# Patient Record
Sex: Female | Born: 2000 | Race: Black or African American | Hispanic: No | Marital: Single | Smoking: Never smoker
Health system: Southern US, Community
[De-identification: ages and names within clinical notes are randomized; demographics above are authoritative.]

## PROBLEM LIST (undated history)

## (undated) DIAGNOSIS — O139 Gestational [pregnancy-induced] hypertension without significant proteinuria, unspecified trimester: Secondary | ICD-10-CM

## (undated) DIAGNOSIS — Z3490 Encounter for supervision of normal pregnancy, unspecified, unspecified trimester: Secondary | ICD-10-CM

## (undated) DIAGNOSIS — D649 Anemia, unspecified: Secondary | ICD-10-CM

## (undated) HISTORY — DX: Encounter for supervision of normal pregnancy, unspecified, unspecified trimester: Z34.90

---
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
## 2018-02-12 ENCOUNTER — Other Ambulatory Visit: Payer: Self-pay | Admitting: Nurse Practitioner

## 2018-02-12 DIAGNOSIS — Z369 Encounter for antenatal screening, unspecified: Secondary | ICD-10-CM

## 2018-03-12 ENCOUNTER — Ambulatory Visit (HOSPITAL_BASED_OUTPATIENT_CLINIC_OR_DEPARTMENT_OTHER)
Admission: RE | Admit: 2018-03-12 | Discharge: 2018-03-12 | Disposition: A | Discharge status: Home / Self Care | Payer: Medicaid Other | Source: Ambulatory Visit | Attending: Obstetrics & Gynecology | Admitting: Obstetrics & Gynecology

## 2018-03-12 ENCOUNTER — Ambulatory Visit
Admission: RE | Admit: 2018-03-12 | Discharge: 2018-03-12 | Disposition: A | Discharge status: Home / Self Care | Payer: Medicaid Other | Source: Ambulatory Visit | Attending: Nurse Practitioner | Admitting: Nurse Practitioner

## 2018-03-12 VITALS — BP 117/62 | HR 75 | Temp 98.8°F | Resp 17 | Ht 65.0 in | Wt 113.6 lb

## 2018-03-12 DIAGNOSIS — Z3689 Encounter for other specified antenatal screening: Secondary | ICD-10-CM | POA: Diagnosis present

## 2018-03-12 DIAGNOSIS — Z369 Encounter for antenatal screening, unspecified: Secondary | ICD-10-CM | POA: Diagnosis not present

## 2018-03-12 DIAGNOSIS — Z3A12 12 weeks gestation of pregnancy: Secondary | ICD-10-CM | POA: Insufficient documentation

## 2018-03-12 NOTE — Progress Notes (Signed)
Referring physician:  [facility] Department Length of Consultation: 20 minutes   Lauren Romero  was referred to [facility] [place] for genetic counseling to review prenatal screening and testing options.  This note summarizes the information we discussed. The patient was counseled by Zandra Abts, genetic counseling intern, supervised by Katrina Stack, MS, CGC.    We offered the following routine screening tests for this pregnancy:  First trimester screening, which includes nuchal translucency ultrasound screen and first trimester maternal serum marker screening.  The nuchal translucency has approximately an 80% detection rate for Down syndrome and can be positive for other chromosome abnormalities as well as congenital heart defects.  When combined with a maternal serum marker screening, the detection rate is up to 90% for Down syndrome and up to 97% for trisomy 18.     Maternal serum marker screening, a blood test that measures pregnancy proteins, can provide risk assessments for Down syndrome, trisomy 18, and open neural tube defects (spina bifida, anencephaly). Because it does not directly examine the fetus, it cannot positively diagnose or rule out these problems.  Targeted ultrasound uses high frequency sound waves to create an image of the developing fetus.  An ultrasound is often recommended as a routine means of evaluating the pregnancy.  It is also used to screen for fetal anatomy problems (for example, a heart defect) that might be suggestive of a chromosomal or other abnormality.   Should these screening tests indicate an increased concern, then the following additional testing options would be offered:  The chorionic villus sampling procedure is available for first trimester chromosome analysis.  This involves the withdrawal of a small amount of chorionic villi (tissue from the developing placenta).  Risk of pregnancy loss is estimated to be  approximately 1 in 200 to 1 in 100 (0.5 to 1%).  There is approximately a 1% (1 in 100) chance that the CVS chromosome results will be unclear.  Chorionic villi cannot be tested for neural tube defects.     Amniocentesis involves the removal of a small amount of amniotic fluid from the sac surrounding the fetus with the use of a thin needle inserted through the maternal abdomen and uterus.  Ultrasound guidance is used throughout the procedure.  Fetal cells from amniotic fluid are directly evaluated and > 99.5% of chromosome problems and > 98% of open neural tube defects can be detected. This procedure is generally performed after the 15th week of pregnancy.  The main risks to this procedure include complications leading to miscarriage in less than 1 in 200 cases (0.5%).  As another option for information if the pregnancy is suspected to be an an increased chance for certain chromosome conditions, we also reviewed the availability of cell free fetal DNA testing from maternal blood to determine whether or not the baby may have either Down syndrome, trisomy 71, or trisomy 54.  This test utilizes a maternal blood sample and DNA sequencing technology to isolate circulating cell free fetal DNA from maternal plasma.  The fetal DNA can then be analyzed for DNA sequences that are derived from the three most common chromosomes involved in aneuploidy, chromosomes 13, 18, and 21.  If the overall amount of DNA is greater than the expected level for any of these chromosomes, aneuploidy is suspected.  While we do not consider it a replacement for invasive testing and karyotype analysis, a negative result from this testing would be reassuring, though not a guarantee of a normal chromosome  complement for the baby.  An abnormal result is certainly suggestive of an abnormal chromosome complement, though we would still recommend CVS or amniocentesis to confirm any findings from this testing.  Cystic Fibrosis and Spinal Muscular  Atrophy (SMA) screening were also discussed with the patient. Both conditions are recessive, which means that both parents must be carriers in order to have a child with the disease.  Cystic fibrosis (CF) is one of the most common genetic conditions in persons of Caucasian ancestry.  This condition occurs in approximately 1 in 2,500 Caucasian persons and results in thickened secretions in the lungs, digestive, and reproductive systems.  For a baby to be at risk for having CF, both of the parents must be carriers for this condition.  Approximately 1 in 5725 Caucasian persons is a carrier for CF.  Current carrier testing looks for the most common mutations in the gene for CF and can detect approximately 90% of carriers in the Caucasian population.  This means that the carrier screening can greatly reduce, but cannot eliminate, the chance for an individual to have a child with CF.  If an individual is found to be a carrier for CF, then carrier testing would be available for the partner. As part of [place]'s newborn screening profile, all babies born in the state of [place] will have a two-tier screening process.  Specimens are first tested to determine the concentration of immunoreactive trypsinogen (IRT).  The top 5% of specimens with the highest IRT values then undergo DNA testing using a panel of over 40 common CF mutations. SMA is a neurodegenerative disorder that leads to atrophy of skeletal muscle and overall weakness.  This condition is also more prevalent in the Caucasian population, with 1 in 40-1 in 60 persons being a carrier and 1 in 6,000-1 in 10,000 children being affected.  There are multiple forms of the disease, with some causing death in infancy to other forms with survival into adulthood.  The genetics of SMA is complex, but carrier screening can detect up to 95% of carriers in the Caucasian population.  Similar to CF, a negative result can greatly reduce, but cannot eliminate, the chance  to have a child with SMA.  Hemoglobinopathy screening was also discussed, as the patient and her partner of a African American ancestry.  We have reached out to [facility] regarding results of this testing, which is mentioned in her chart but no result was visible to us.  We inquired about the family history and pregnancy history.  The family history was reported to be unremarkable for birth defects, intellectual delays, recurrent pregnancy loss or known chromosome abnormalities. The patient's father passed away in his sleep recently and the patient is planning to [facility] in the near future.  If this were to uncover a cardiac condition which may affect this pregnancy, please refer her back for additional counseling.    Lauren Romero stated that this is her first pregnancy.  She reported no complications or exposures in this pregnancy that would be expected to increase the risk for birth defects.  After consideration of the options, Lauren Romero elected to proceed with an ultrasound and first trimester screening.  She declined carrier screening for CF, SMA and hemoglobinopathies.  If hemoglobinopathy screening has not been performed, this could be ordered at [facility] (or her newborn screening results may be available).  An ultrasound was performed at the time of the visit.  The gestational age was consistent  with 12 weeks.  Fetal anatomy could not be assessed due to early gestational age.  Please refer to the ultrasound report for details of that study.  Lauren Romero was encouraged to call with questions or concerns.  We can be contacted at [phone].  Labs ordered: first trimester screening  Cherly Anderson, MS, CGC

## 2018-03-12 NOTE — Progress Notes (Signed)
I have reviewed the genetic counseling and agree.  Janeann ForehandS. Maryjayne Kleven, MD

## 2018-03-19 ENCOUNTER — Telehealth: Payer: Self-pay | Admitting: Obstetrics and Gynecology

## 2018-03-19 NOTE — Telephone Encounter (Signed)
  Ms. Laiche elected to undergo First Trimester screening on 03/19/2018.  To review, first trimester screening, includes nuchal translucency ultrasound screen and/or first trimester maternal serum marker screening.  The nuchal translucency has approximately an 80% detection rate for Down syndrome and can be positive for other chromosome abnormalities as well as heart defects.  When combined with a maternal serum marker screening, the detection rate is up to 90% for Down syndrome and up to 97% for trisomy 13 and 18.     The results of the First Trimester Nuchal Translucency and Biochemical Screening were within normal range.  The risk for Down syndrome is now estimated to be less than 1 in 10,000.  The risk for Trisomy 13/18 is also estimated to be less than 1 in 10,000.  Should more definitive information be desired, we would offer amniocentesis.  Because we do not yet know the effectiveness of combined first and second trimester screening, we do not recommend a maternal serum screen to assess the chance for chromosome conditions.  However, if screening for neural tube defects is desired, maternal serum screening for AFP only can be performed between 15 and [redacted] weeks gestation.     Cherly Anderson, MS, CGC

## 2018-04-23 ENCOUNTER — Other Ambulatory Visit: Payer: Medicaid Other

## 2018-06-20 NOTE — L&D Delivery Note (Signed)
Delivery Note  First Stage: Labor onset: 0800  Augmentation : none Analgesia /Anesthesia intrapartum: fentanyl IVPM x 1 dose, epidural SROM at 1750, meconium  Second Stage: Complete dilation at 2034 Onset of pushing at 2034 FHR second stage Late decels, FHR to 70bpm.   Admitted in early labor with bloody show, SROM with meconium at 1750, FHR prolonged decel at 8cm around 1850, then fetal bradycardia at C/C/+3; Pt counseled regarding need to assist delivery due to bradycardia. Foley cath removed approx prior to delivery, Position confirmed in LOA position, vacuum applied to flexion point, vaginal tissue was clear of vacuum, pressure increased to green with next UC, assisted to crowning via flat kiwi with 1 pull with maternal pushing efforts, vacuum removed.  Delivery of a viable female infant on 09/07/18 at 2046 by Heloise Ochoa CNM delivery of fetal head in LOA position with restitution to LOT. No nuchal cord;  Anterior then posterior shoulders delivered easily with gentle downward traction. Baby vigorous at perineum, and placed on mom's chest, and attended to by peds.  Cord double clamped after cessation of pulsation, cut by grandmother of baby.    Third Stage: Placenta delivered spontaneously intact with 3VC @ 2050 Placenta disposition: routine disposal Uterine tone Firm / bleeding scant  2nd deg laceration identified  Anesthesia for repair: epidural Repair 2-0 Vicryl CT-1, 3-0 Vicryl SH Est. Blood Loss (mL):  Complications: none  Mom to postpartum.  Baby to Couplet care / Skin to Skin.  Newborn: Birth Weight: pending  Apgar Scores: 8/9 Feeding planned: formula

## 2018-09-03 ENCOUNTER — Inpatient Hospital Stay: Payer: Medicaid Other

## 2018-09-03 ENCOUNTER — Encounter: Payer: Self-pay | Admitting: Oncology

## 2018-09-03 ENCOUNTER — Inpatient Hospital Stay (HOSPITAL_BASED_OUTPATIENT_CLINIC_OR_DEPARTMENT_OTHER): Payer: Medicaid Other | Admitting: Oncology

## 2018-09-03 ENCOUNTER — Inpatient Hospital Stay: Payer: Medicaid Other | Attending: Oncology | Admitting: Oncology

## 2018-09-03 ENCOUNTER — Other Ambulatory Visit: Payer: Self-pay

## 2018-09-03 ENCOUNTER — Encounter (INDEPENDENT_AMBULATORY_CARE_PROVIDER_SITE_OTHER): Payer: Self-pay

## 2018-09-03 ENCOUNTER — Telehealth: Payer: Self-pay | Admitting: Oncology

## 2018-09-03 VITALS — BP 121/69 | HR 75 | Temp 97.9°F | Resp 18 | Ht 66.54 in | Wt 135.0 lb

## 2018-09-03 DIAGNOSIS — Z3A38 38 weeks gestation of pregnancy: Secondary | ICD-10-CM | POA: Diagnosis not present

## 2018-09-03 DIAGNOSIS — O9902 Anemia complicating childbirth: Secondary | ICD-10-CM

## 2018-09-03 DIAGNOSIS — O99019 Anemia complicating pregnancy, unspecified trimester: Secondary | ICD-10-CM

## 2018-09-03 LAB — CBC WITH DIFFERENTIAL/PLATELET
Abs Immature Granulocytes: 0.1 10*3/uL — ABNORMAL HIGH (ref 0.00–0.07)
Basophils Absolute: 0 10*3/uL (ref 0.0–0.1)
Basophils Relative: 0 %
EOS ABS: 0 10*3/uL (ref 0.0–0.5)
Eosinophils Relative: 0 %
HCT: 31.8 % — ABNORMAL LOW (ref 36.0–46.0)
Hemoglobin: 10.3 g/dL — ABNORMAL LOW (ref 12.0–15.0)
Immature Granulocytes: 1 %
Lymphocytes Relative: 18 %
Lymphs Abs: 1.5 10*3/uL (ref 0.7–4.0)
MCH: 27.7 pg (ref 26.0–34.0)
MCHC: 32.4 g/dL (ref 30.0–36.0)
MCV: 85.5 fL (ref 80.0–100.0)
Monocytes Absolute: 0.5 10*3/uL (ref 0.1–1.0)
Monocytes Relative: 6 %
Neutro Abs: 6.5 10*3/uL (ref 1.7–7.7)
Neutrophils Relative %: 75 %
Platelets: 245 10*3/uL (ref 150–400)
RBC: 3.72 MIL/uL — ABNORMAL LOW (ref 3.87–5.11)
RDW: 14.6 % (ref 11.5–15.5)
WBC: 8.6 10*3/uL (ref 4.0–10.5)
nRBC: 0 % (ref 0.0–0.2)

## 2018-09-03 LAB — IRON AND TIBC
Iron: 37 ug/dL (ref 28–170)
Saturation Ratios: 8 % — ABNORMAL LOW (ref 10.4–31.8)
TIBC: 484 ug/dL — ABNORMAL HIGH (ref 250–450)
UIBC: 447 ug/dL

## 2018-09-03 LAB — RETICULOCYTES
Immature Retic Fract: 22.4 % — ABNORMAL HIGH (ref 2.3–15.9)
RBC.: 3.72 MIL/uL — ABNORMAL LOW (ref 3.87–5.11)
RETIC COUNT ABSOLUTE: 76.3 10*3/uL (ref 19.0–186.0)
Retic Ct Pct: 2.1 % (ref 0.4–3.1)

## 2018-09-03 LAB — VITAMIN B12: Vitamin B-12: 254 pg/mL (ref 180–914)

## 2018-09-03 LAB — TSH: TSH: 0.841 u[IU]/mL (ref 0.350–4.500)

## 2018-09-03 LAB — FOLATE: Folate: 12.1 ng/mL (ref >5.9)

## 2018-09-03 LAB — FERRITIN: Ferritin: 7 ng/mL — ABNORMAL LOW (ref 11–307)

## 2018-09-03 NOTE — Telephone Encounter (Signed)
Pt. Mother called about appointment for this afternoon. Mother said she wanted to be seen this morning instead. Gave her a 10:45 appt. It is not 11:30 and PT still has not shown up. Marking appt as a no show.

## 2018-09-03 NOTE — Progress Notes (Signed)
Here for new pt evaluation. . Pt stated she lives w mother, is pregnant ( first ) and is due March 31/20202   Stated she "sometimes takes " her iron and pre natal vitamins.

## 2018-09-04 ENCOUNTER — Encounter: Payer: Self-pay | Admitting: *Deleted

## 2018-09-04 LAB — HAPTOGLOBIN: Haptoglobin: 129 mg/dL (ref 33–278)

## 2018-09-04 NOTE — Progress Notes (Signed)
I called the patient and informed her of her anemia work-up.  She has moderate anemia with a hemoglobin of 10.3.  iron studies do reveal iron deficiency with a low ferritin level of 7, elevated TIBC of 484 and a low iron saturation of 8%.  She also had borderline low B12 levels at 253.  Patient is due to deliver in 2 weeks time and the benefit of giving IV iron late in her pregnancy is uncertain.  Also given her pregnancy she is immunosuppressed and potentially at a higher risk in the face of a covert pandemic if she has to come to our clinic to get four infusions over the next 2 weeks.  Weighing the risks and benefits have recommended that she should take oral iron 325 mg twice a day along with oral B12 thousand micrograms p.o. daily.  Patient states that she will keep an alarm and make sure that she remains compliant with her oral medications.  If she feels that she cannot keep up with the oral regimen over the next few days she will call us and we will set her up for IV iron at that time.  Otherwise she will keep up her appointment with me in 3 months as scheduled and we will reevaluate her iron studies at that time and give her IV iron if need be  Dr. Owens Shark, MD, MPH [facility] at [facility] Pager- [phone] 09/04/2018 10:09 AM

## 2018-09-06 ENCOUNTER — Encounter: Payer: Self-pay | Admitting: Oncology

## 2018-09-06 NOTE — Progress Notes (Signed)
Hematology/Oncology Consult note [facility] [phone] [phone]  Patient Care Team: [facility], [facility] as [facility]CP - General (General Practice)   Name of the patient: Lauren Romero  [id number]  03/05/2001    Reason for referral- anemia in pregnancy   Referring physician- [place] [facility]  Date of visit: 09/06/18   History of presenting illness-patient is a 18 year old female currently in her third trimester of pregnancy and due to deliver end of March 2020.  She is G1, P1 L0.  She has been referred to us for anemia.Most recent H&H from January 2020 revealed hemoglobin of 9.9/hematocrit of 30.6.  Patient has not been consistently taking her prenatal multivitamins or her iron tablets.  She reports feeling mildly fatigued but denies other complaints.  Her pregnancy is otherwise proceeding uneventfully so far.  ECOG PS- 0  Pain scale- 0   Review of systems- Review of Systems  Constitutional: Positive for malaise/fatigue. Negative for chills, fever and weight loss.  HENT: Negative for congestion, ear discharge and nosebleeds.   Eyes: Negative for blurred vision.  Respiratory: Negative for cough, hemoptysis, sputum production, shortness of breath and wheezing.   Cardiovascular: Negative for chest pain, palpitations, orthopnea and claudication.  Gastrointestinal: Negative for abdominal pain, blood in stool, constipation, diarrhea, heartburn, melena, nausea and vomiting.  Genitourinary: Negative for dysuria, flank pain, frequency, hematuria and urgency.  Musculoskeletal: Negative for back pain, joint pain and myalgias.  Skin: Negative for rash.  Neurological: Negative for dizziness, tingling, focal weakness, seizures, weakness and headaches.  Endo/Heme/Allergies: Does not bruise/bleed easily.  Psychiatric/Behavioral: Negative for depression and suicidal ideas. The patient does not have insomnia.     No  Known Allergies  Patient Active Problem List   Diagnosis Date Noted  . First trimester screening      Past Medical History:  Diagnosis Date  . Medical history non-contributory   . Pregnant due date march 31/2020     History reviewed. No pertinent surgical history.  Social History   Socioeconomic History  . Marital status: Single    Spouse name: Not on file  . Number of children: Not on file  . Years of education: Not on file  . Highest education level: Not on file  Occupational History  . Occupation: None  Social Needs  . Financial resource strain: Not on file  . Food insecurity:    Worry: Not on file    Inability: Not on file  . Transportation needs:    Medical: Not on file    Non-medical: Not on file  Tobacco Use  . Smoking status: Never Smoker  . Smokeless tobacco: Never Used  Substance and Sexual Activity  . Alcohol use: No  . Drug use: Not Currently  . Sexual activity: Yes  Lifestyle  . Physical activity:    Days per week: Not on file    Minutes per session: Not on file  . Stress: Not on file  Relationships  . Social connections:    Talks on phone: Not on file    Gets together: Not on file    Attends religious service: Not on file    Active member of club or organization: Not on file    Attends meetings of clubs or organizations: Not on file    Relationship status: Not on file  . Intimate partner violence:    Fear of current or ex partner: Not on file    Emotionally abused: Not on file  Physically abused: Not on file    Forced sexual activity: Not on file  Other Topics Concern  . Not on file  Social History Narrative  . Not on file     Family History  Problem Relation Age of Onset  . Diabetes Maternal Grandmother   . Hypertension Maternal Grandmother   . Diabetes Paternal Grandmother   . Hypertension Paternal Grandmother      Current Outpatient Medications:  .  Ferrous Fumarate (IRON) 18 MG TBCR, Take 1 tablet (18 mg total) by mouth  daily. (Patient not taking: Reported on 09/03/2018), Disp: 30 each, Rfl: 0 .  Prenatal Vit-Fe Fumarate-FA (PRENATAL MULTIVITAMIN) TABS tablet, Take 1 tablet by mouth daily at 12 noon., Disp: , Rfl:    Physical exam:  Vitals:   09/03/18 1226  BP: 121/69  Pulse: 75  Resp: 18  Temp: 97.9 F (36.6 C)  TempSrc: Tympanic  Weight: 135 lb (61.2 kg)  Height: 5' 6.54" (1.69 m)   Physical Exam HENT:     Head: Normocephalic and atraumatic.  Eyes:     Pupils: Pupils are equal, round, and reactive to light.  Neck:     Musculoskeletal: Normal range of motion.  Cardiovascular:     Rate and Rhythm: Normal rate and regular rhythm.     Heart sounds: Normal heart sounds.  Pulmonary:     Effort: Pulmonary effort is normal.     Breath sounds: Normal breath sounds.  Abdominal:     Comments: Gravid uterus  Skin:    General: Skin is warm and dry.  Neurological:     Mental Status: She is alert and oriented to person, place, and time.        CMP Latest Ref Rng & Units 06/22/2016  Glucose 65 - 99 mg/dL 326(Z)  BUN 6 - 20 mg/dL 9  Creatinine 1.24 - 5.80 mg/dL 9.98  Sodium 338 - 250 mmol/L 137  Potassium 3.5 - 5.1 mmol/L 3.1(L)  Chloride 101 - 111 mmol/L 106  CO2 22 - 32 mmol/L 23  Calcium 8.9 - 10.3 mg/dL 9.4  Total Protein 6.5 - 8.1 g/dL -  Total Bilirubin 0.3 - 1.2 mg/dL -  Alkaline Phos 47 - 539 U/L -  AST 15 - 41 U/L -  ALT 14 - 54 U/L -   CBC Latest Ref Rng & Units 09/03/2018  WBC 4.0 - 10.5 K/uL 8.6  Hemoglobin 12.0 - 15.0 g/dL 10.3(L)  Hematocrit 36.0 - 46.0 % 31.8(L)  Platelets 150 - 400 K/uL 245     Assessment and plan- Patient is a 18 y.o. female referred for anemia in third trimester of pregnancy   Today I will check CBC with differential, ferritin and iron studies, B12 and folate, reticulocyte haptoglobin and TSH.  Given that she is so close to delivery I will see her 3 months from now with repeat CBC ferritin and iron studies.  I did call the patient after the results  of her blood work came back which were consistent with iron deficiency anemia given that her ferritin levels were low at 7 And iron studies showed a low iron saturation of 8% and elevated TIBC of 484.  B12 levels were also low normal at 254.  CBC showed moderate anemia with a hemoglobin of 10.3.  I spoke to the patient over the phone and discussed that she is very close to her delivery only 2 weeks away.  Options would be to take oral iron more consistently as well as oral  B12 thousand micrograms p.o. daily versus coming for for IV benefit infusions over the next 2 weeks which may not add a significant benefit given that she is so close to delivery.  Also puts her at risk for more infusion center visits during her pregnancy in the midst of covid pandemic.  However if she does not take oral iron consistently she will need IV iron.  Patient understands benefits of oral versus IV iron and states that she would take oral iron 325 mg twice a day consistently.  She knows to call us if she wishes to come for IV iron  Thank you for this kind referral and the opportunity to participate in the care of this patient   Visit Diagnosis 1. Antepartum anemia     Dr. Owens Shark, MD, MPH [facility] at [facility] [phone] 09/06/2018  1:59 PM

## 2018-09-07 ENCOUNTER — Inpatient Hospital Stay: Payer: Medicaid Other | Admitting: Anesthesiology

## 2018-09-07 ENCOUNTER — Inpatient Hospital Stay
Admission: EM | Admit: 2018-09-07 | Discharge: 2018-09-09 | DRG: 806 | Disposition: A | Discharge status: Home / Self Care | Payer: Medicaid Other | Attending: Obstetrics and Gynecology | Admitting: Obstetrics and Gynecology

## 2018-09-07 ENCOUNTER — Other Ambulatory Visit: Payer: Self-pay

## 2018-09-07 ENCOUNTER — Encounter: Payer: Self-pay | Admitting: *Deleted

## 2018-09-07 DIAGNOSIS — O9081 Anemia of the puerperium: Secondary | ICD-10-CM | POA: Diagnosis not present

## 2018-09-07 DIAGNOSIS — D62 Acute posthemorrhagic anemia: Secondary | ICD-10-CM | POA: Diagnosis not present

## 2018-09-07 DIAGNOSIS — O26893 Other specified pregnancy related conditions, third trimester: Secondary | ICD-10-CM | POA: Diagnosis present

## 2018-09-07 DIAGNOSIS — Z3A38 38 weeks gestation of pregnancy: Secondary | ICD-10-CM | POA: Diagnosis not present

## 2018-09-07 HISTORY — DX: Anemia, unspecified: D64.9

## 2018-09-07 LAB — CBC
HCT: 34 % — ABNORMAL LOW (ref 36.0–46.0)
Hemoglobin: 10.8 g/dL — ABNORMAL LOW (ref 12.0–15.0)
MCH: 27 pg (ref 26.0–34.0)
MCHC: 31.8 g/dL (ref 30.0–36.0)
MCV: 85 fL (ref 80.0–100.0)
NRBC: 0 % (ref 0.0–0.2)
Platelets: 258 10*3/uL (ref 150–400)
RBC: 4 MIL/uL (ref 3.87–5.11)
RDW: 14.7 % (ref 11.5–15.5)
WBC: 14.7 10*3/uL — ABNORMAL HIGH (ref 4.0–10.5)

## 2018-09-07 LAB — TYPE AND SCREEN
ABO/RH(D): B POS
Antibody Screen: NEGATIVE

## 2018-09-07 MED ORDER — PHENYLEPHRINE 40 MCG/ML (10ML) SYRINGE FOR IV PUSH (FOR BLOOD PRESSURE SUPPORT)
80.0000 ug | PREFILLED_SYRINGE | INTRAVENOUS | Status: DC | PRN
  Filled 2018-09-07: qty 10

## 2018-09-07 MED ORDER — LIDOCAINE HCL (PF) 1 % IJ SOLN
INTRAMUSCULAR | Status: DC | PRN
  Administered 2018-09-07: 3 mL

## 2018-09-07 MED ORDER — EPHEDRINE 5 MG/ML INJ
10.0000 mg | INTRAVENOUS | Status: DC | PRN
  Filled 2018-09-07: qty 2

## 2018-09-07 MED ORDER — ACETAMINOPHEN 325 MG PO TABS: 650.0000 mg | ORAL_TABLET | ORAL | Status: DC | PRN

## 2018-09-07 MED ORDER — LACTATED RINGERS IV SOLN
500.0000 mL | INTRAVENOUS | Status: DC | PRN
  Administered 2018-09-07: 500 mL via INTRAVENOUS

## 2018-09-07 MED ORDER — LIDOCAINE HCL (PF) 1 % IJ SOLN: 30.0000 mL | INTRAMUSCULAR | Status: DC | PRN

## 2018-09-07 MED ORDER — MISOPROSTOL 200 MCG PO TABS
ORAL_TABLET | ORAL | Status: AC
  Filled 2018-09-07: qty 4

## 2018-09-07 MED ORDER — DIPHENHYDRAMINE HCL 50 MG/ML IJ SOLN: 12.5000 mg | INTRAMUSCULAR | Status: DC | PRN

## 2018-09-07 MED ORDER — TERBUTALINE SULFATE 1 MG/ML IJ SOLN
INTRAMUSCULAR | Status: AC
  Administered 2018-09-07: 25 mg
  Filled 2018-09-07: qty 1

## 2018-09-07 MED ORDER — FENTANYL 2.5 MCG/ML W/ROPIVACAINE 0.15% IN NS 100 ML EPIDURAL (ARMC)
EPIDURAL | Status: DC | PRN
  Administered 2018-09-07: 12 mL/h via EPIDURAL

## 2018-09-07 MED ORDER — AMMONIA AROMATIC IN INHA
RESPIRATORY_TRACT | Status: AC
  Filled 2018-09-07: qty 10

## 2018-09-07 MED ORDER — BUPIVACAINE HCL (PF) 0.25 % IJ SOLN
INTRAMUSCULAR | Status: DC | PRN
  Administered 2018-09-07 (×2): 4 mL via EPIDURAL

## 2018-09-07 MED ORDER — OXYTOCIN BOLUS FROM INFUSION
500.0000 mL | Freq: Once | INTRAVENOUS | Status: AC
  Administered 2018-09-07: 500 mL via INTRAVENOUS

## 2018-09-07 MED ORDER — OXYTOCIN 10 UNIT/ML IJ SOLN
INTRAMUSCULAR | Status: AC
  Filled 2018-09-07: qty 2

## 2018-09-07 MED ORDER — LIDOCAINE HCL (PF) 1 % IJ SOLN
INTRAMUSCULAR | Status: AC
  Filled 2018-09-07: qty 30

## 2018-09-07 MED ORDER — FENTANYL CITRATE (PF) 100 MCG/2ML IJ SOLN
INTRAMUSCULAR | Status: AC
  Filled 2018-09-07: qty 2

## 2018-09-07 MED ORDER — FENTANYL-BUPIVACAINE-NACL 0.5-0.125-0.9 MG/250ML-% EP SOLN: 12.0000 mL/h | EPIDURAL | Status: DC | PRN

## 2018-09-07 MED ORDER — OXYTOCIN 40 UNITS IN NORMAL SALINE INFUSION - SIMPLE MED
2.5000 [IU]/h | INTRAVENOUS | Status: DC
  Administered 2018-09-07 – 2018-09-08 (×2): 2.5 [IU]/h via INTRAVENOUS
  Filled 2018-09-07 (×2): qty 1000

## 2018-09-07 MED ORDER — LACTATED RINGERS IV SOLN
INTRAVENOUS | Status: DC
  Administered 2018-09-07 (×2): via INTRAVENOUS

## 2018-09-07 MED ORDER — ONDANSETRON HCL 4 MG/2ML IJ SOLN: 4.0000 mg | Freq: Four times a day (QID) | INTRAMUSCULAR | Status: DC | PRN

## 2018-09-07 MED ORDER — LACTATED RINGERS IV SOLN: 500.0000 mL | Freq: Once | INTRAVENOUS | Status: DC

## 2018-09-07 MED ORDER — SOD CITRATE-CITRIC ACID 500-334 MG/5ML PO SOLN: 30.0000 mL | ORAL | Status: DC | PRN

## 2018-09-07 MED ORDER — FENTANYL CITRATE (PF) 100 MCG/2ML IJ SOLN
100.0000 ug | INTRAMUSCULAR | Status: DC | PRN
  Administered 2018-09-07: 100 ug via INTRAVENOUS

## 2018-09-07 MED ORDER — LIDOCAINE-EPINEPHRINE (PF) 1.5 %-1:200000 IJ SOLN
INTRAMUSCULAR | Status: DC | PRN
  Administered 2018-09-07: 3 mL via PERINEURAL

## 2018-09-07 MED ORDER — FENTANYL 2.5 MCG/ML W/ROPIVACAINE 0.15% IN NS 100 ML EPIDURAL (ARMC)
EPIDURAL | Status: AC
  Filled 2018-09-07: qty 100

## 2018-09-07 NOTE — Anesthesia Procedure Notes (Signed)
Epidural Patient location during procedure: OB Start time: 09/07/2018 4:30 PM End time: 09/07/2018 4:34 PM  Staffing Anesthesiologist: Yevette Edwards, MD Resident/CRNA: Stormy Fabian, CRNA Performed: resident/CRNA   Preanesthetic Checklist Completed: patient identified, site marked, surgical consent, pre-op evaluation, timeout performed, IV checked, risks and benefits discussed and monitors and equipment checked  Epidural Patient position: sitting Prep: ChloraPrep Patient monitoring: heart rate, continuous pulse ox and blood pressure Approach: midline Location: L3-L4 Injection technique: LOR saline  Needle:  Needle type: Tuohy  Needle gauge: 17 G Needle length: 9 cm and 9 Needle insertion depth: 7 cm Catheter type: closed end flexible Catheter size: 19 Gauge Catheter at skin depth: 11 cm Test dose: negative and 1.5% lidocaine with Epi 1:200 K  Assessment Sensory level: T10 Events: blood not aspirated, injection not painful, no injection resistance, negative IV test and no paresthesia  Additional Notes 1 attempt Pt. Evaluated and documentation done after procedure finished. Patient identified. Risks/Benefits/Options discussed with patient including but not limited to bleeding, infection, nerve damage, paralysis, failed block, incomplete pain control, headache, blood pressure changes, nausea, vomiting, reactions to medication both or allergic, itching and postpartum back pain. Confirmed with bedside nurse the patient's most recent platelet count. Confirmed with patient that they are not currently taking any anticoagulation, have any bleeding history or any family history of bleeding disorders. Patient expressed understanding and wished to proceed. All questions were answered. Sterile technique was used throughout the entire procedure. Please see nursing notes for vital signs. Test dose was given through epidural catheter and negative prior to continuing to dose epidural or start  infusion. Warning signs of high block given to the patient including shortness of breath, tingling/numbness in hands, complete motor block, or any concerning symptoms with instructions to call for help. Patient was given instructions on fall risk and not to get out of bed. All questions and concerns addressed with instructions to call with any issues or inadequate analgesia.   Patient tolerated the insertion well without immediate complications.Reason for block:procedure for pain

## 2018-09-07 NOTE — Discharge Summary (Signed)
Obstetrical Discharge Summary  Patient Name: Lauren Romero DOB: Sep 07, 2000 MRN: [id number]  Date of Admission: 09/07/2018 Date of Delivery: 09/07/18 Delivered by: Lauren Romero CNM Date of Discharge: 09/09/2018  Primary OB: [facility]  [id number] last menstrual period was 12/13/2017. EDC Estimated Date of Delivery: 09/19/18 Gestational Age at Delivery: [redacted]w[redacted]d   Antepartum complications:  1. Anemia, seen for heme consult this week.  2. Limited prenatal visits  Admitting Diagnosis: Labor Secondary Diagnosis: vacuum assist vaginal birth, 2nd deg lac, meconium stained fluid  Patient Active Problem List   Diagnosis Date Noted  . Labor and delivery, indication for care 09/07/2018  . First trimester screening     Augmentation: none Complications: None Intrapartum complications/course: Admitted in early labor with bloody show, SROM with meconium, FHR prolonged decel at 8cm, then fetal bradycardia at C/C/+3; vacuum assist with flat kiwi with 1 pull, vacuum removed. Infant delivered, spontaneously crying at perineum.  Date of Delivery: 09/07/18 at 2046 Delivered By: Lauren Romero CNM Delivery Type: vacuum, outlet Anesthesia: epidural Placenta: spontaneous Laceration: 2nd deg lac Episiotomy: none  Newborn Data: Live born female  Birth Weight:  2950g 6lb 8.1oz APGAR: 8, 9   Newborn Delivery   Birth date/time:  09/07/2018 20:46:00 Delivery type:  Vaginal, Vacuum (Extractor)     Postpartum Procedures: none  Post partum course:  Patient had an uncomplicated postpartum course.  By time of discharge on PPD#2, her pain was controlled on oral pain medications; she had appropriate lochia and was ambulating, voiding without difficulty and tolerating regular diet.  She was deemed stable for discharge to home.      Discharge Physical Exam:  BP 113/60 (BP Location: Left Arm)   Pulse 80   Temp 98.6 F (37 C) (Oral)   Resp 18   Ht 5\' 4"  (1.626 m)   Wt 61.2 kg   LMP 12/13/2017   SpO2 100%    Breastfeeding Unknown   BMI 23.17 kg/m   General: NAD CV: RRR Pulm: CTABL, nl effort ABD: s/nd/nt, fundus firm and below the umbilicus Lochia: moderate DVT Evaluation: LE non-ttp, no evidence of DVT on exam.  Hemoglobin  Date Value Ref Range Status  09/09/2018 8.5 (L) 12.0 - 15.0 g/dL Final   HCT  Date Value Ref Range Status  09/09/2018 26.4 (L) 36.0 - 46.0 % Final     Disposition: stable, discharge to home. Baby Feeding: formula Baby Disposition: home with mom  Rh Immune globulin given: n/a Rubella vaccine given: s/p vaccines x 2 Varicella vaccine given: s/p vaccines x 2 Tdap vaccine given in AP or PP setting: 07/11/18 Flu vaccine given in AP or PP setting: declined  Contraception: TBD  Prenatal Labs:  Blood type/Rh B Pos  Antibody screen neg  Rubella Immune  Varicella Immune  RPR NR  HBsAg Neg  HIV NR  GC neg  Chlamydia neg  Genetic screening negative  1 hour GTT  134  GBS  Neg     Plan:  Lauren Romero was discharged to home in good condition. Follow-up appointment with delivering provider in 6 weeks.  Discharge Medications: Allergies as of 09/09/2018   No Known Allergies     Medication List    STOP taking these medications   Iron 18 MG Tbcr     TAKE these medications   acetaminophen 325 MG tablet Commonly known as:  Tylenol Take 2 tablets (650 mg total) by mouth every 4 (four) hours as needed for mild pain or moderate pain.   ascorbic acid 500 MG  tablet Commonly known as:  VITAMIN C Take 1 tablet (500 mg total) by mouth 2 (two) times daily with a meal.   ferrous sulfate 325 (65 FE) MG tablet Take 1 tablet (325 mg total) by mouth 2 (two) times daily with a meal.   ibuprofen 600 MG tablet Commonly known as:  ADVIL,MOTRIN Take 1 tablet (600 mg total) by mouth every 6 (six) hours as needed for mild pain, moderate pain or cramping.   prenatal multivitamin Tabs tablet Take 1 tablet by mouth daily at 12 noon.       Follow-up Information     Romero, Lauren Pair, CNM Follow up in 6 week(s).   Specialty:  Obstetrics and Gynecology Contact information: [address] [place] [address] [phone]           Signed: Genia Romero, CNM 09/09/2018  9:17 AM

## 2018-09-07 NOTE — OB Triage Note (Signed)
Ctx since 0800 this am. Denies vaginal bleeding, LOF. Reports good fetal movement. Lauren Romero

## 2018-09-07 NOTE — H&P (Signed)
OB History & Physical   History of Present Illness:  Chief Complaint: painful UCs since 0800 this morning.   HPI:  Lauren Romero is a 18 y.o. G1P0 female at [redacted]w[redacted]d dated by LMP and c/w [redacted]w[redacted]d.  She presents to L&D for painful UCs since this morning with bloody show. Reports active FM, denies LOF.     Pregnancy Issues: 1. Anemia, seen for heme consult this week.  2. Limited prenatal visits   Maternal Medical History:   Past Medical History:  Diagnosis Date  . Anemia   . Pregnant due date march 31/2020    History reviewed. No pertinent surgical history.  No Known Allergies  Prior to Admission medications   Medication Sig Start Date End Date Taking? Authorizing Provider  Ferrous Fumarate (IRON) 18 MG TBCR Take 1 tablet (18 mg total) by mouth daily. 02/03/16  Yes Schaevitz, Myra Rude, MD  Prenatal Vit-Fe Fumarate-FA (PRENATAL MULTIVITAMIN) TABS tablet Take 1 tablet by mouth daily at 12 noon.    [provider]     Prenatal care site: [facility]   Social History: She  reports that she has never smoked. She has never used smokeless tobacco. She reports previous drug use. She reports that she does not drink alcohol.  Family History: family history includes Diabetes in her maternal grandmother and paternal grandmother; Hypertension in her maternal grandmother and paternal grandmother.   Review of Systems: A full review of systems was performed and negative except as noted in the HPI.     Physical Exam:  Vital Signs: BP 110/72 (BP Location: Right Arm)   Pulse 79   Temp 98.2 F (36.8 C)   Resp 16   Ht 5\' 4"  (1.626 m)   Wt 61.2 kg   LMP 12/13/2017   BMI 23.17 kg/m  General: no acute distress.  HEENT: normocephalic, atraumatic Heart: regular rate & rhythm.  No murmurs/rubs/gallops Lungs: clear to auscultation bilaterally, normal respiratory effort Abdomen: soft, gravid, non-tender;  EFW: 7.5lbs Pelvic:   External: Normal external female  genitalia  Cervix: Dilation: 3 / Effacement (%): 80 / Station: -2    Extremities: non-tender, symmetric, no edema bilaterally.  DTRs: 2+  Neurologic: Alert & oriented x 3.    Results for orders placed or performed during the hospital encounter of 09/07/18 (from the past 24 hour(s))  CBC     Status: Abnormal   Collection Time: 09/07/18  3:45 PM  Result Value Ref Range   WBC 14.7 (H) 4.0 - 10.5 K/uL   RBC 4.00 3.87 - 5.11 MIL/uL   Hemoglobin 10.8 (L) 12.0 - 15.0 g/dL   HCT 53.2 (L) 99.2 - 42.6 %   MCV 85.0 80.0 - 100.0 fL   MCH 27.0 26.0 - 34.0 pg   MCHC 31.8 30.0 - 36.0 g/dL   RDW 83.4 19.6 - 22.2 %   Platelets 258 150 - 400 K/uL   nRBC 0.0 0.0 - 0.2 %    Pertinent Results:  Prenatal Labs: Blood type/Rh  B Pos  Antibody screen neg  Rubella Immune  Varicella Immune  RPR NR  HBsAg Neg  HIV NR  GC neg  Chlamydia neg  Genetic screening negative  1 hour GTT  134  GBS  Neg   FHT: 135bpm,  Mod variability, + accels, no decels currently. - 2 decels noted while in triage with spontaneous recovery and broken tracing.   TOCO: q89min UCs, palp moderate.  SVE:  Dilation: 3 / Effacement (%): 80 /  Station: -2    Cephalic by leopolds and SVE by RN  No results found.  Assessment:  Lauren Romero is a 18 y.o. G1P0 female at [redacted]w[redacted]d with early labor, cervical change.   Plan:  1. Admit to Labor & Delivery; consents reviewed and obtained  2. Fetal Well being  - Fetal Tracing: Cat II- decels noted in triage, has not re-occurred.  - Group B Streptococcus ppx indicated: Negative - Presentation: cephalic confirmed by exam and Leopolds   3. Routine OB: - Prenatal labs reviewed, as above - Rh B Pos - CBC, T&S, RPR on admit - Clear fluids, IVF  4. Monitoring of Labor -  Contractions: external toco in place -  Pelvis proven to unproven, adequate for TOL.  -  Plan for augmentation prn with AROM if needed.  -  Plan for continuous fetal monitoring  -  Maternal pain control as desired;  s/p IVPM x 1 dose, now receiving regional anesthesia - Anticipate vaginal delivery   Randa Ngo, CNM 09/07/18 4:30 PM

## 2018-09-07 NOTE — Anesthesia Preprocedure Evaluation (Addendum)
Anesthesia Evaluation  Patient identified by MRN, date of birth, ID band Patient awake    Reviewed: Allergy & Precautions, H&P , NPO status , Patient's Chart, lab work & pertinent test results, reviewed documented beta blocker date and time   Airway Mallampati: II  TM Distance: >3 FB Neck ROM: full    Dental no notable dental hx. (+) Teeth Intact   Pulmonary neg pulmonary ROS,    Pulmonary exam normal breath sounds clear to auscultation       Cardiovascular Exercise Tolerance: Good negative cardio ROS   Rhythm:regular Rate:Normal     Neuro/Psych negative neurological ROS  negative psych ROS   GI/Hepatic negative GI ROS, Neg liver ROS,   Endo/Other  negative endocrine ROSWell Controlled  Renal/GU   negative genitourinary   Musculoskeletal   Abdominal   Peds  Hematology  (+) Blood dyscrasia, anemia ,   Anesthesia Other Findings   Reproductive/Obstetrics (+) Pregnancy                            Anesthesia Physical Anesthesia Plan  ASA: II  Anesthesia Plan: Epidural   Post-op Pain Management:    Induction:   PONV Risk Score and Plan:   Airway Management Planned:   Additional Equipment:   Intra-op Plan:   Post-operative Plan:   Informed Consent: I have reviewed the patients History and Physical, chart, labs and discussed the procedure including the risks, benefits and alternatives for the proposed anesthesia with the patient or authorized representative who has indicated his/her understanding and acceptance.       Plan Discussed with:   Anesthesia Plan Comments:         Anesthesia Quick Evaluation

## 2018-09-07 NOTE — Progress Notes (Signed)
Labor Progress Note  Lauren Romero is a 18 y.o. G1P0 at [redacted]w[redacted]d by LMP admitted for Labor  Subjective: called to room for FHR decel.   Objective: BP 134/83 (BP Location: Left Arm)   Pulse (!) 123   Temp 98.1 F (36.7 C) (Axillary)   Resp 16   Ht 5\' 4"  (1.626 m)   Wt 61.2 kg   LMP 12/13/2017   SpO2 100%   BMI 23.17 kg/m  Notable VS details: reviewed  Fetal Assessment: FHT:  Variable and early decels noted on tracing then prolonged decel x to 80-90bpm, moderate variability throughout.  Multiple position changes, IV bolus, oxygen by facemask. SVE by CNM, 8/80/-1 with heavy show, small blood clot 2cm noted, meconium stained fluid continues. FSE and IUPC placed,  Amnioinfusion started, bolus, then 175ml/hr. Terb given. Dr Dalbert Garnet called and notified of decel and current tracing. FHR recovered to 125bpm with late decels, repositioned to left exaggerated Sims. After interventions, FHR now 145bpm, moderate variability, no accels, no decels.   Category/reactivity:  Category II UC:   regular, every 2 minutes SVE:   8/80/-1 Membrane status: SROM 1750 meconium Amniotic color: meconium stained fluid   Labs: Lab Results  Component Value Date   WBC 14.7 (H) 09/07/2018   HGB 10.8 (L) 09/07/2018   HCT 34.0 (L) 09/07/2018   MCV 85.0 09/07/2018   PLT 258 09/07/2018    Assessment / Plan: spontaneous Labor  Cat II tracing  Labor: progressing spontaneously, continue to monitor closely.  Preeclampsia:  no e/o Pre-e Fetal Wellbeing:  Prolonged decel with recurrent lates following. Now Cat I tracing, will monitor closely.  Pain Control:  Epidural I/D:  n/a Anticipated MOD:  anticipate SVD   Randa Ngo, CNM 09/07/2018, 7:44 PM

## 2018-09-07 NOTE — Plan of Care (Signed)
Reviewed plan of care with patient. All questions answered. Will monitor closely. 

## 2018-09-08 ENCOUNTER — Encounter: Payer: Self-pay | Admitting: Obstetrics and Gynecology

## 2018-09-08 LAB — CBC
HCT: 27.7 % — ABNORMAL LOW (ref 36.0–46.0)
Hemoglobin: 8.8 g/dL — ABNORMAL LOW (ref 12.0–15.0)
MCH: 27.1 pg (ref 26.0–34.0)
MCHC: 31.8 g/dL (ref 30.0–36.0)
MCV: 85.2 fL (ref 80.0–100.0)
Platelets: 184 10*3/uL (ref 150–400)
RBC: 3.25 MIL/uL — ABNORMAL LOW (ref 3.87–5.11)
RDW: 14.6 % (ref 11.5–15.5)
WBC: 15.9 10*3/uL — ABNORMAL HIGH (ref 4.0–10.5)
nRBC: 0 % (ref 0.0–0.2)

## 2018-09-08 MED ORDER — IBUPROFEN 600 MG PO TABS
600.0000 mg | ORAL_TABLET | Freq: Four times a day (QID) | ORAL | Status: DC
  Administered 2018-09-08 – 2018-09-09 (×5): 600 mg via ORAL
  Filled 2018-09-08 (×6): qty 1

## 2018-09-08 MED ORDER — ONDANSETRON HCL 4 MG/2ML IJ SOLN: 4.0000 mg | INTRAMUSCULAR | Status: DC | PRN

## 2018-09-08 MED ORDER — WITCH HAZEL-GLYCERIN EX PADS: 1.0000 "application " | MEDICATED_PAD | CUTANEOUS | Status: DC | PRN

## 2018-09-08 MED ORDER — ZOLPIDEM TARTRATE 5 MG PO TABS: 5.0000 mg | ORAL_TABLET | Freq: Every evening | ORAL | Status: DC | PRN

## 2018-09-08 MED ORDER — ACETAMINOPHEN 325 MG PO TABS
650.0000 mg | ORAL_TABLET | ORAL | Status: DC | PRN
  Administered 2018-09-08 – 2018-09-09 (×5): 650 mg via ORAL
  Filled 2018-09-08 (×4): qty 2

## 2018-09-08 MED ORDER — FERROUS SULFATE 325 (65 FE) MG PO TABS
325.0000 mg | ORAL_TABLET | Freq: Two times a day (BID) | ORAL | Status: DC
  Administered 2018-09-08 – 2018-09-09 (×3): 325 mg via ORAL
  Filled 2018-09-08 (×3): qty 1

## 2018-09-08 MED ORDER — SIMETHICONE 80 MG PO CHEW: 80.0000 mg | CHEWABLE_TABLET | ORAL | Status: DC | PRN

## 2018-09-08 MED ORDER — BENZOCAINE-MENTHOL 20-0.5 % EX AERO
1.0000 "application " | INHALATION_SPRAY | CUTANEOUS | Status: DC | PRN
  Administered 2018-09-08: 1 via TOPICAL
  Filled 2018-09-08: qty 56

## 2018-09-08 MED ORDER — DIPHENHYDRAMINE HCL 25 MG PO CAPS: 25.0000 mg | ORAL_CAPSULE | Freq: Four times a day (QID) | ORAL | Status: DC | PRN

## 2018-09-08 MED ORDER — DIBUCAINE 1 % RE OINT: 1.0000 "application " | TOPICAL_OINTMENT | RECTAL | Status: DC | PRN

## 2018-09-08 MED ORDER — SENNOSIDES-DOCUSATE SODIUM 8.6-50 MG PO TABS
2.0000 | ORAL_TABLET | ORAL | Status: DC
  Administered 2018-09-08 – 2018-09-09 (×2): 2 via ORAL
  Filled 2018-09-08 (×2): qty 2

## 2018-09-08 MED ORDER — PRENATAL MULTIVITAMIN CH
1.0000 | ORAL_TABLET | Freq: Every day | ORAL | Status: DC
  Administered 2018-09-08: 1 via ORAL
  Filled 2018-09-08 (×2): qty 1

## 2018-09-08 MED ORDER — COCONUT OIL OIL: 1.0000 "application " | TOPICAL_OIL | Status: DC | PRN

## 2018-09-08 MED ORDER — VITAMIN C 500 MG PO TABS
500.0000 mg | ORAL_TABLET | Freq: Two times a day (BID) | ORAL | Status: DC
  Administered 2018-09-08 – 2018-09-09 (×2): 500 mg via ORAL
  Filled 2018-09-08 (×2): qty 1

## 2018-09-08 MED ORDER — ONDANSETRON HCL 4 MG PO TABS: 4.0000 mg | ORAL_TABLET | ORAL | Status: DC | PRN

## 2018-09-08 NOTE — Anesthesia Postprocedure Evaluation (Signed)
Anesthesia Post Note  Patient: Lauren Romero  Procedure(s) Performed: AN AD HOC LABOR EPIDURAL  Patient location during evaluation: Mother Baby Anesthesia Type: Epidural Level of consciousness: awake and alert Pain management: pain level controlled Vital Signs Assessment: post-procedure vital signs reviewed and stable Respiratory status: spontaneous breathing, nonlabored ventilation and respiratory function stable Cardiovascular status: stable Postop Assessment: no headache, no backache and epidural receding Anesthetic complications: no     Last Vitals:  Vitals:   09/08/18 0239 09/08/18 0831  BP: 134/78 124/73  Pulse: 90 80  Resp: 18 20  Temp: 37 C 36.8 C  SpO2: 100% 100%    Last Pain:  Vitals:   09/08/18 0831  TempSrc: Oral  PainSc:                  Jordi Kamm S

## 2018-09-08 NOTE — Progress Notes (Signed)
Pt viewed Purple crying video.

## 2018-09-08 NOTE — Progress Notes (Signed)
Post Partum Day 1  Subjective: Doing well, some lower back pain. Ambulating without difficulty, pain managed with PO meds, tolerating regular diet, and voiding without difficulty.   No fever/chills, chest pain, shortness of breath, nausea/vomiting, or leg pain. No nipple or breast pain.  Objective: BP 124/73 (BP Location: Left Arm)   Pulse 80   Temp 98.2 F (36.8 C) (Oral)   Resp 20   Ht 5\' 4"  (1.626 m)   Wt 61.2 kg   LMP 12/13/2017   SpO2 100%   Breastfeeding Unknown   BMI 23.17 kg/m    Physical Exam:  General: alert, cooperative, appears stated age and no distress Breasts: soft/nontender CV: RRR Pulm: nl effort, CTABL Abdomen: soft, non-tender, active bowel sounds Uterine Fundus: firm Lochia: appropriate DVT Evaluation: No evidence of DVT seen on physical exam. No cords or calf tenderness. No significant calf/ankle edema.  Recent Labs    09/07/18 1545 09/08/18 0657  HGB 10.8* 8.8*  HCT 34.0* 27.7*  WBC 14.7* 15.9*  PLT 258 184    Assessment/Plan: 18 y.o. G1P1001 postpartum day # 1  -Continue routine postpartum care -Encouraged snug fitting bra, cold application, Tylenol PRN, and cabbage leaves for engorgement for formula feeding . -Acute blood loss anemia - hemodynamically stable and asymptomatic; start PO ferrous sulfate BID with stool softeners  -Immunization status: all immunizations up to date  Disposition: Continue inpatient postpartum care    LOS: 1 day   Genia Del, CNM 09/08/2018, 9:18 AM   ----- Genia Del Certified Nurse Midwife [facility]

## 2018-09-09 LAB — CBC
HCT: 26.4 % — ABNORMAL LOW (ref 36.0–46.0)
Hemoglobin: 8.5 g/dL — ABNORMAL LOW (ref 12.0–15.0)
MCH: 27.8 pg (ref 26.0–34.0)
MCHC: 32.2 g/dL (ref 30.0–36.0)
MCV: 86.3 fL (ref 80.0–100.0)
Platelets: 210 10*3/uL (ref 150–400)
RBC: 3.06 MIL/uL — ABNORMAL LOW (ref 3.87–5.11)
RDW: 15 % (ref 11.5–15.5)
WBC: 13.9 10*3/uL — AB (ref 4.0–10.5)
nRBC: 0 % (ref 0.0–0.2)

## 2018-09-09 LAB — RPR: RPR Ser Ql: NONREACTIVE

## 2018-09-09 MED ORDER — ACETAMINOPHEN 325 MG PO TABS: 650.0000 mg | ORAL_TABLET | ORAL | 0 refills | Status: DC | PRN

## 2018-09-09 MED ORDER — ASCORBIC ACID 500 MG PO TABS: 500.0000 mg | ORAL_TABLET | Freq: Two times a day (BID) | ORAL | 1 refills | Status: DC

## 2018-09-09 MED ORDER — IBUPROFEN 600 MG PO TABS: 600.0000 mg | ORAL_TABLET | Freq: Four times a day (QID) | ORAL | 0 refills | Status: DC | PRN

## 2018-09-09 MED ORDER — FERROUS SULFATE 325 (65 FE) MG PO TABS: 325.0000 mg | ORAL_TABLET | Freq: Two times a day (BID) | ORAL | 1 refills | Status: DC

## 2018-09-09 NOTE — Progress Notes (Signed)
Reviewed D/C instructions with pt and family. Pt verbalized understanding of teaching. Discharged to home via W/C. Pt to schedule f/u appt.  

## 2018-12-04 ENCOUNTER — Inpatient Hospital Stay: Payer: Medicaid Other

## 2018-12-04 ENCOUNTER — Other Ambulatory Visit: Payer: Medicaid Other

## 2018-12-04 ENCOUNTER — Ambulatory Visit: Payer: Medicaid Other | Admitting: Oncology

## 2018-12-05 ENCOUNTER — Telehealth: Payer: Self-pay | Admitting: *Deleted

## 2018-12-05 ENCOUNTER — Inpatient Hospital Stay: Payer: Medicaid Other | Admitting: Oncology

## 2018-12-05 NOTE — Telephone Encounter (Signed)
Pt was suppose to come and get labs 6/16 and then see md video today. She did not come for labs. She would like to do it next week. She wants to come in and see md. . I made the appt for 10:45 labs and 11:15 to see md  On 6/25. Pt agreeable to the appt

## 2018-12-13 ENCOUNTER — Inpatient Hospital Stay: Payer: Medicaid Other | Attending: Oncology | Admitting: Oncology

## 2018-12-13 ENCOUNTER — Inpatient Hospital Stay: Payer: Medicaid Other

## 2018-12-13 ENCOUNTER — Other Ambulatory Visit: Payer: Self-pay

## 2018-12-13 NOTE — Progress Notes (Unsigned)
Per patient - she request that md reach out to her for the virtual visit at [phone]

## 2018-12-16 ENCOUNTER — Other Ambulatory Visit: Payer: Self-pay | Admitting: Oncology

## 2018-12-16 DIAGNOSIS — E538 Deficiency of other specified B group vitamins: Secondary | ICD-10-CM

## 2018-12-16 DIAGNOSIS — D509 Iron deficiency anemia, unspecified: Secondary | ICD-10-CM

## 2018-12-19 IMAGING — US US MFM OB COMPLETE +14 WKS
1 series · 13 of 28 positions shown · non-contrast
Comparison: none

PATIENT INFO:

PERFORMED BY:
Performed By:     Tessie Pocasangre          Secondary Phy.:   INGUNN HARPA RONLOR
Sonographer
MIMORI
SERVICE(S) PROVIDED:
INDICATIONS:
12 weeks gestation of pregnancy
First Trimester screen
FETAL EVALUATION:
Num Of Fetuses:         1
Fetal Heart Rate(bpm):  153
Cardiac Activity:       Present
Presentation:           Vertex/oblique
Placenta:               Anterior
P. Cord Insertion:      Normal
BIOMETRY:
CRL:      65.6  mm     G. Age:  12w 6d                  EDD:   09/18/18
GESTATIONAL AGE:
LMP:           12w 5d        Date:  12/13/17                 EDD:   09/19/18
Best:          12w 6d     Det. By:  U/S C R L (03/12/18)     EDD:   09/18/18
1ST TRIMESTER GENETIC SONOGRAM SCREENING:
CRL:            65.6  mm    G. Age:   12w 6d                 EDD:   09/18/18
Nuc Trans:       1.5  mm
ANATOMY:
Cranium:               Within Normal Limits   Bladder:                Seen
Choroid Plexus:        Within normal limits   Upper Extremities:      Visualized
for gestational age
Stomach:               Seen                   Lower Extremities:      Visualized
Abdominal Wall:        Within normal limits
CERVIX UTERUS ADNEXA:
Left Ovary
Size(cm)       3.5  x   2.8    x  1.9       Vol(ml):
Cyst noted measuring 
 2cm, most likely consistent with corpus luetum
Right Ovary
Right ovary not visualized

[Series 1: us mfm ob complete +14 wks · 0.13mm/px · 13 of 64 slices shown]
[im 3/64]
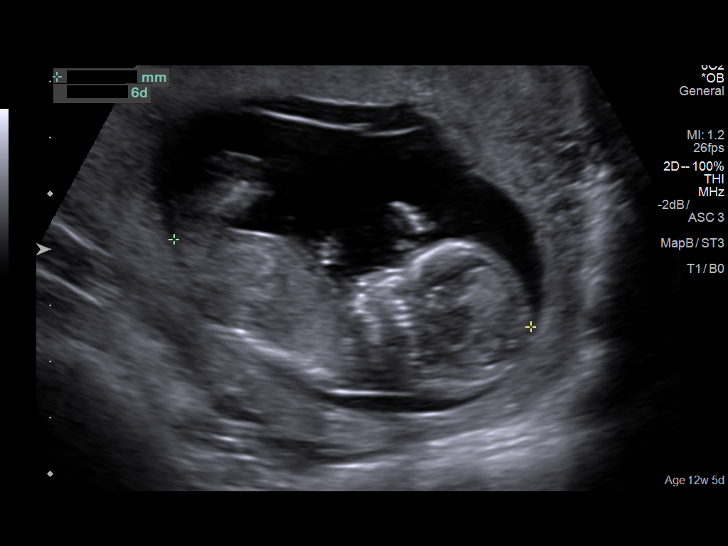
[im 8/64]
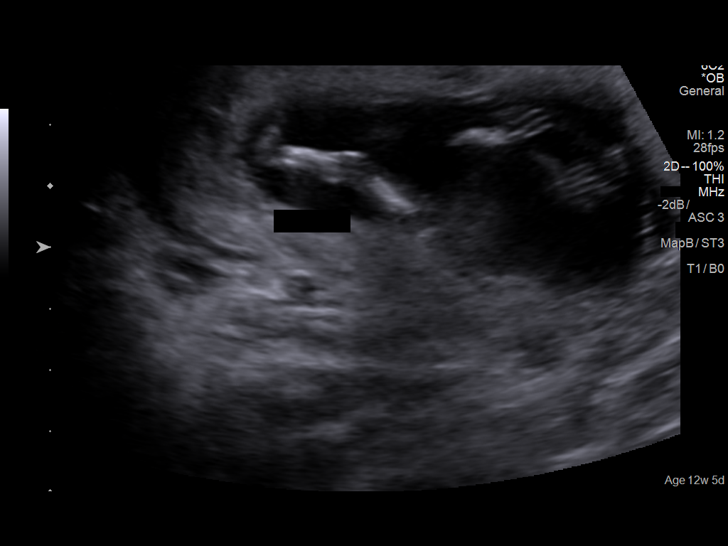
[im 12/64]
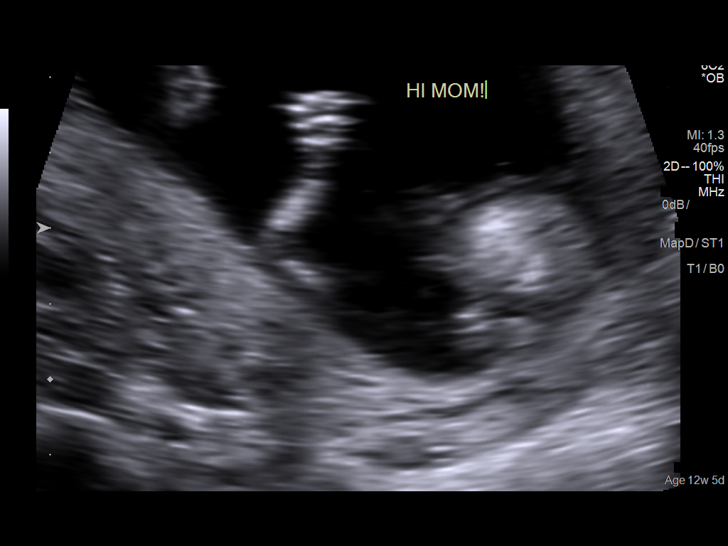
[im 17/64]
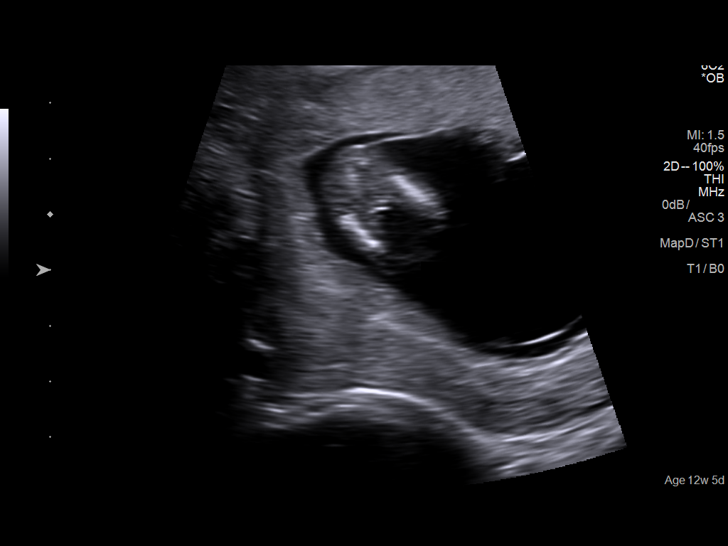
[im 22/64]
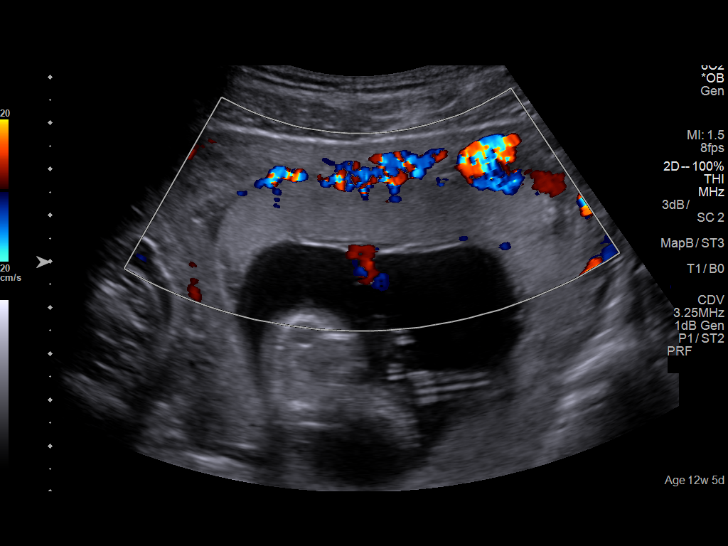
[im 26/64]
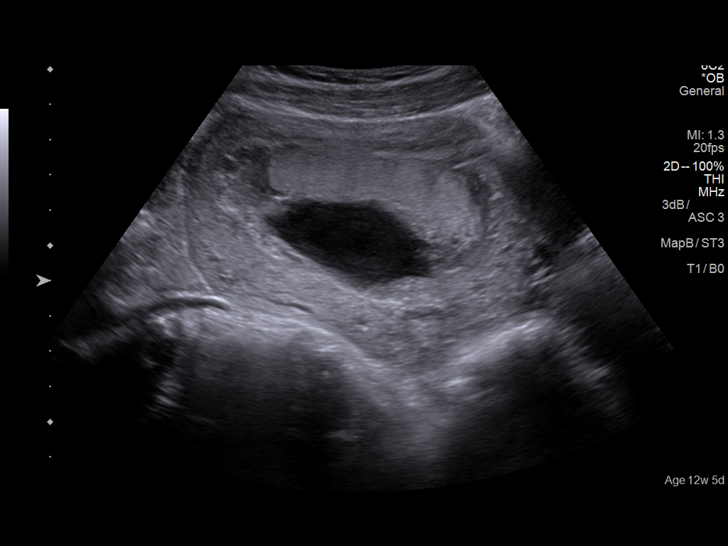
[im 33/64]
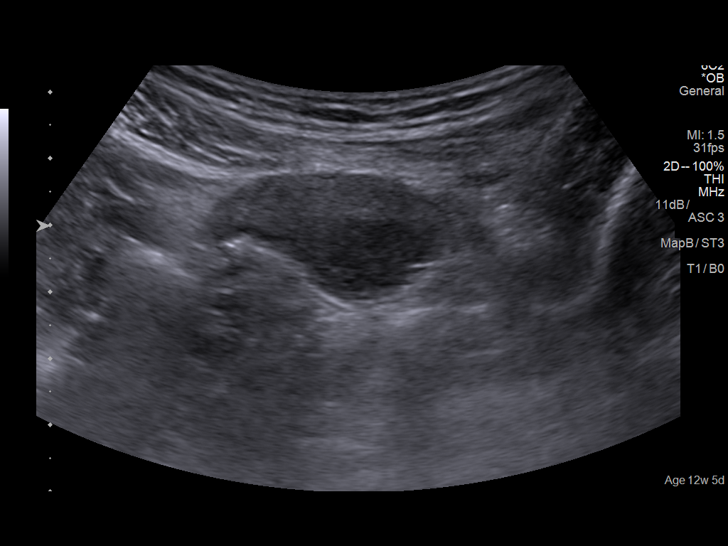
[im 38/64]
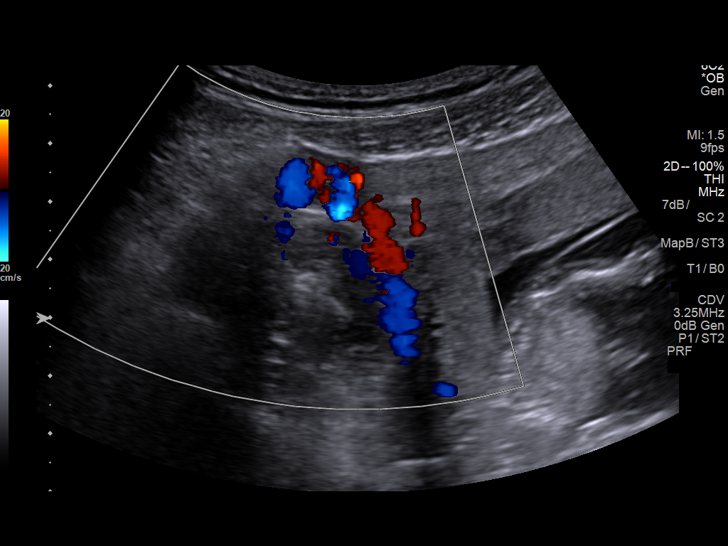
[im 43/64]
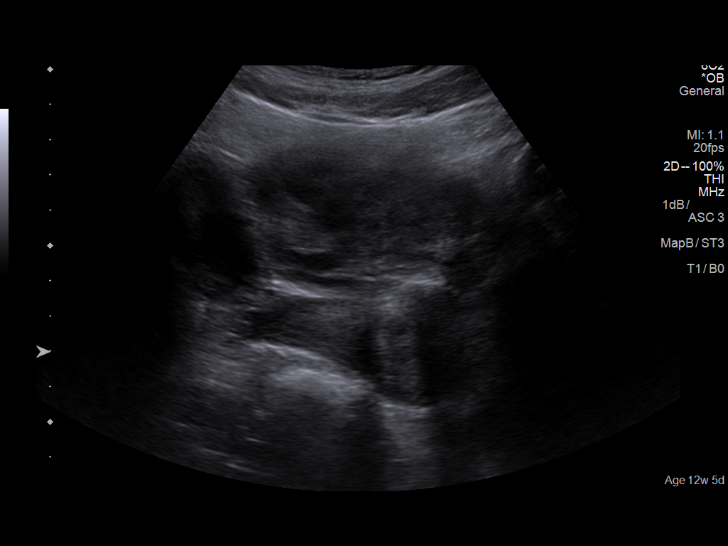
[im 47/64]
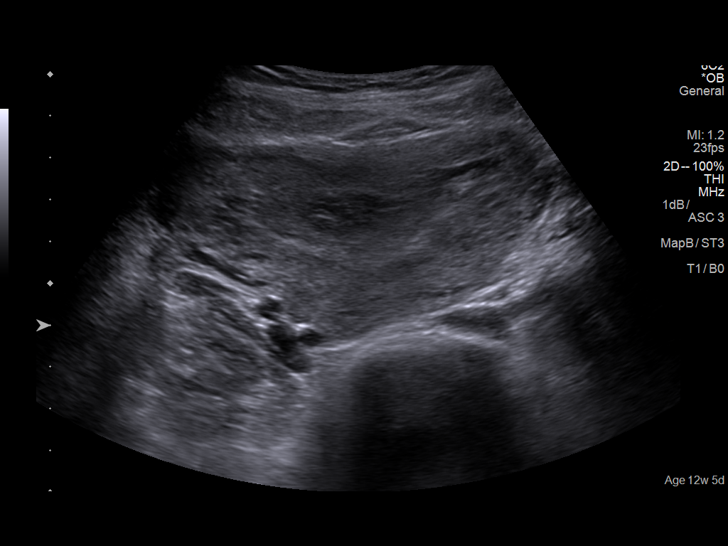
[im 52/64]
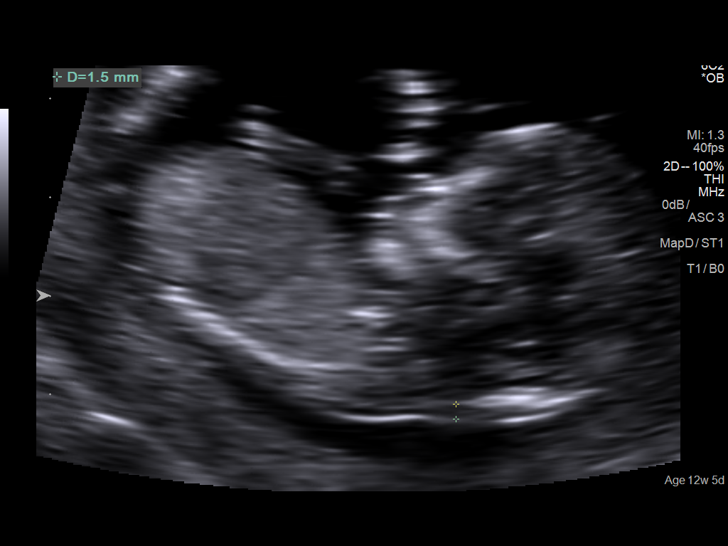
[im 57/64]
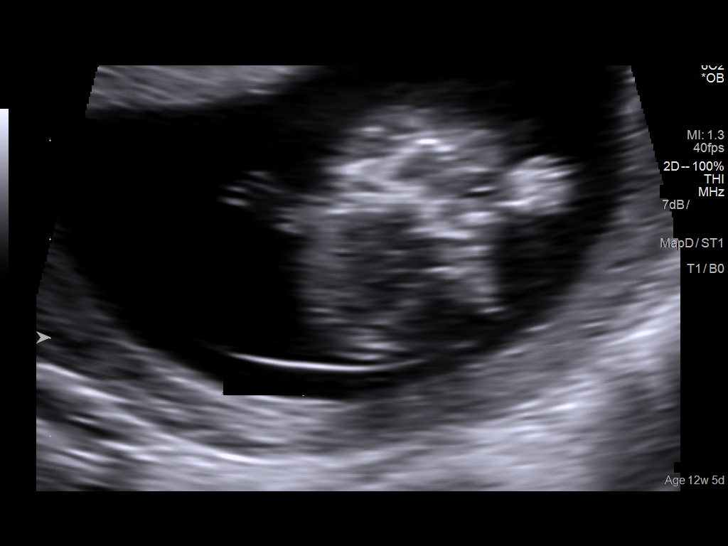
[im 61/64]
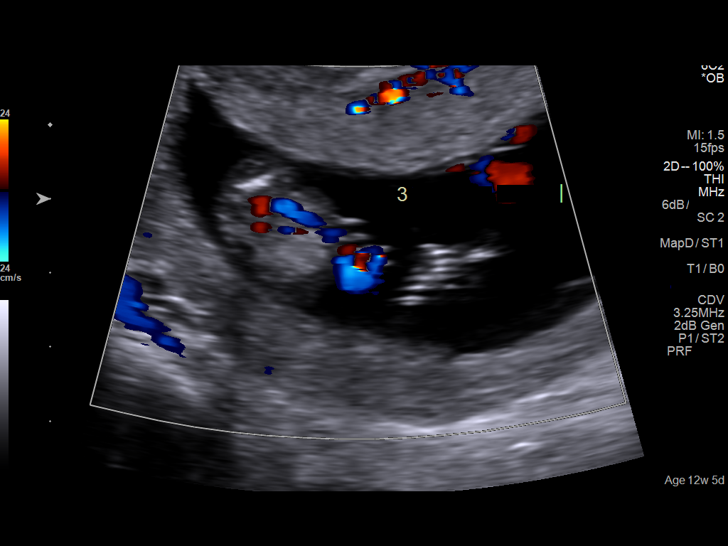

[13 of 28 positions shown; findings below may reference images not displayed]

IMPRESSION: Thank you for referring your patient to [facility] for the first trimester nuchal
translucency screening program.

A singleton gestation is noted at 12 weeks 9days based on
today's imaging given her menses are irregular.

The fetal biometry correlates with last menstrual period
dating.

The nuchal translucency measured 1.5 mm.

The fetal nasal bone was noted to be sub-optimally visualized
due to fetal position.

The ovaries were WNL.

The patient was scheduled to have blood drawn for first
trimester screening.

A composite risk incorporating her age, nuchal translucency
measurement and blood results should be returned to your
office shortly ([facility]).

First trimester screening (Beta HCG/UM/AFP/nuchal
translucency) to assess aneuploidy risk has been studied
prospectively in more than 100,000 women and appears to
be a reliable means for screening for Down syndrome,
trisomy 18, and trisomy 13.

The only way to exclude a diagnosis of aneuploidy is by
invasive testing such as chorionic villus sampling or
amniocentesis.

We recommend an anatomic survey at 
18 weeks gestation.
Thank you for allowing us to participate in your patient's care.
assistance.

## 2018-12-20 ENCOUNTER — Inpatient Hospital Stay: Payer: Medicaid Other | Admitting: Oncology

## 2018-12-20 ENCOUNTER — Inpatient Hospital Stay: Payer: Medicaid Other | Attending: Oncology

## 2019-02-13 ENCOUNTER — Telehealth: Payer: Self-pay | Admitting: Oncology

## 2019-02-13 NOTE — Telephone Encounter (Signed)
PT's mother said PT would call back if she needed care 02/13/2019

## 2019-03-04 ENCOUNTER — Other Ambulatory Visit: Payer: Self-pay

## 2019-03-04 ENCOUNTER — Emergency Department
Admission: EM | Admit: 2019-03-04 | Discharge: 2019-03-04 | Disposition: A | Discharge status: Home / Self Care | Payer: Medicaid Other | Attending: Emergency Medicine | Admitting: Emergency Medicine

## 2019-03-04 ENCOUNTER — Encounter: Payer: Self-pay | Admitting: *Deleted

## 2019-03-04 DIAGNOSIS — N390 Urinary tract infection, site not specified: Secondary | ICD-10-CM | POA: Insufficient documentation

## 2019-03-04 DIAGNOSIS — R1032 Left lower quadrant pain: Secondary | ICD-10-CM | POA: Diagnosis present

## 2019-03-04 DIAGNOSIS — Z79899 Other long term (current) drug therapy: Secondary | ICD-10-CM | POA: Insufficient documentation

## 2019-03-04 LAB — URINALYSIS, COMPLETE (UACMP) WITH MICROSCOPIC
Bacteria, UA: NONE SEEN
Bilirubin Urine: NEGATIVE
Glucose, UA: NEGATIVE mg/dL
Ketones, ur: NEGATIVE mg/dL
Nitrite: NEGATIVE
Protein, ur: NEGATIVE mg/dL
Specific Gravity, Urine: 1.024 (ref 1.005–1.030)
pH: 5 (ref 5.0–8.0)

## 2019-03-04 LAB — CBC
HCT: 33.8 % — ABNORMAL LOW (ref 36.0–46.0)
Hemoglobin: 10.4 g/dL — ABNORMAL LOW (ref 12.0–15.0)
MCH: 24.9 pg — ABNORMAL LOW (ref 26.0–34.0)
MCHC: 30.8 g/dL (ref 30.0–36.0)
MCV: 81.1 fL (ref 80.0–100.0)
Platelets: 410 10*3/uL — ABNORMAL HIGH (ref 150–400)
RBC: 4.17 MIL/uL (ref 3.87–5.11)
RDW: 14.5 % (ref 11.5–15.5)
WBC: 10.6 10*3/uL — ABNORMAL HIGH (ref 4.0–10.5)
nRBC: 0 % (ref 0.0–0.2)

## 2019-03-04 LAB — COMPREHENSIVE METABOLIC PANEL
ALT: 14 U/L (ref 0–44)
AST: 22 U/L (ref 15–41)
Albumin: 4 g/dL (ref 3.5–5.0)
Alkaline Phosphatase: 54 U/L (ref 38–126)
Anion gap: 9 (ref 5–15)
BUN: 11 mg/dL (ref 6–20)
CO2: 24 mmol/L (ref 22–32)
Calcium: 8.9 mg/dL (ref 8.9–10.3)
Chloride: 106 mmol/L (ref 98–111)
Creatinine, Ser: 0.92 mg/dL (ref 0.44–1.00)
GFR calc Af Amer: >60 mL/min (ref >60)
GFR calc non Af Amer: >60 mL/min (ref >60)
Glucose, Bld: 91 mg/dL (ref 70–99)
Potassium: 3.9 mmol/L (ref 3.5–5.1)
Sodium: 139 mmol/L (ref 135–145)
Total Bilirubin: 1 mg/dL (ref 0.3–1.2)
Total Protein: 7.6 g/dL (ref 6.5–8.1)

## 2019-03-04 LAB — LIPASE, BLOOD: Lipase: 28 U/L (ref 11–51)

## 2019-03-04 LAB — POCT PREGNANCY, URINE: Preg Test, Ur: NEGATIVE

## 2019-03-04 MED ORDER — CEPHALEXIN 500 MG PO CAPS: 500.0000 mg | ORAL_CAPSULE | Freq: Two times a day (BID) | ORAL | 0 refills | Status: DC

## 2019-03-04 NOTE — ED Notes (Signed)
MD at bedside. 

## 2019-03-04 NOTE — ED Provider Notes (Signed)
[facility] Emergency Department Provider Note   ____________________________________________    I have reviewed the triage vital signs and the nursing notes.   HISTORY  Chief Complaint Abdominal Pain     HPI Lauren Romero is a 18 y.o. female who presents with complaints of left lower quadrant abdominal discomfort.  Patient reports a pulling sensation in her left lower abdomen/suprapubic region.  She reports that started yesterday.  She reports some mild discomfort with urination.  No vaginal discharge, no concern for STDs.  No fevers or chills.  No nausea or vomiting.  Has not take anything for this.  Past Medical History:  Diagnosis Date  . Anemia   . Pregnant due date march 31/2020    Patient Active Problem List   Diagnosis Date Noted  . Labor and delivery, indication for care 09/07/2018  . First trimester screening     History reviewed. No pertinent surgical history.  Prior to Admission medications   Medication Sig Start Date End Date Taking? Authorizing Provider  acetaminophen (TYLENOL) 325 MG tablet Take 2 tablets (650 mg total) by mouth every 4 (four) hours as needed for mild pain or moderate pain. 09/09/18   Lisette Grinder, CNM  cephALEXin (KEFLEX) 500 MG capsule Take 1 capsule (500 mg total) by mouth 2 (two) times daily. 03/04/19   Lavonia Drafts, MD  ibuprofen (ADVIL,MOTRIN) 600 MG tablet Take 1 tablet (600 mg total) by mouth every 6 (six) hours as needed for mild pain, moderate pain or cramping. 09/09/18   Lisette Grinder, CNM     Allergies Patient has no known allergies.  Family History  Problem Relation Age of Onset  . Diabetes Maternal Grandmother   . Hypertension Maternal Grandmother   . Diabetes Paternal Grandmother   . Hypertension Paternal Grandmother     Social History Social History   Tobacco Use  . Smoking status: Never Smoker  . Smokeless tobacco: Never Used  Substance Use Topics  . Alcohol use: No  .  Drug use: Not Currently    Review of Systems  Constitutional: No fever/chills Eyes: No visual changes.  ENT: No sore throat. Cardiovascular: Denies chest pain. Respiratory: Denies shortness of breath. Gastrointestinal: As above.   Genitourinary: As above Musculoskeletal: Negative for back pain. Skin: Negative for rash. Neurological: Negative for headaches    ____________________________________________   PHYSICAL EXAM:  VITAL SIGNS: ED Triage Vitals  Enc Vitals Group     BP 03/04/19 1230 134/73     Pulse Rate 03/04/19 1230 62     Resp 03/04/19 1230 16     Temp 03/04/19 1230 98.1 F (36.7 C)     Temp Source 03/04/19 1230 Oral     SpO2 03/04/19 1230 100 %     Weight 03/04/19 1231 61.2 kg (134 lb 14.7 oz)     Height 03/04/19 1231 1.626 m (5\' 4" )     Head Circumference --      Peak Flow --      Pain Score 03/04/19 1231 7     Pain Loc --      Pain Edu? --      Excl. in [place]? --     Constitutional: Alert and oriented.    Mouth/Throat: Mucous membranes are moist.    Cardiovascular: Normal rate, regular rhythm. Grossly normal heart sounds.  Good peripheral circulation. Respiratory: Normal respiratory effort.  No retractions. Lungs CTAB. Gastrointestinal: Soft and nontender. No distention.  No CVA tenderness.  During exam Genitourinary: deferred Musculoskeletal:  Warm and well perfused Neurologic:  Normal speech and language. No gross focal neurologic deficits are appreciated.  Skin:  Skin is warm, dry and intact. No rash noted. Psychiatric: Mood and affect are normal. Speech and behavior are normal.  ____________________________________________   LABS (all labs ordered are listed, but only abnormal results are displayed)  Labs Reviewed  CBC - Abnormal; Notable for the following components:      Result Value   WBC 10.6 (*)    Hemoglobin 10.4 (*)    HCT 33.8 (*)    MCH 24.9 (*)    Platelets 410 (*)    All other components within normal limits  URINALYSIS,  COMPLETE (UACMP) WITH MICROSCOPIC - Abnormal; Notable for the following components:   Color, Urine YELLOW (*)    APPearance HAZY (*)    Hgb urine dipstick SMALL (*)    Leukocytes,Ua MODERATE (*)    All other components within normal limits  LIPASE, BLOOD  COMPREHENSIVE METABOLIC PANEL  POC URINE PREG, ED  POCT PREGNANCY, URINE   ____________________________________________  EKG   ____________________________________________  RADIOLOGY   ____________________________________________   PROCEDURES  Procedure(s) performed: No  Procedures   Critical Care performed: No ____________________________________________   INITIAL IMPRESSION / ASSESSMENT AND PLAN / ED COURSE  Pertinent labs & imaging results that were available during my care of the patient were reviewed by me and considered in my medical decision making (see chart for details).  Patient overall well-appearing in no acute distress.  Lab work is reassuring, urinalysis suspicious for urinary tract infection, given her dysuria, will treat with antibiotics, outpatient follow-up as needed, return precautions discussed.    ____________________________________________   FINAL CLINICAL IMPRESSION(S) / ED DIAGNOSES  Final diagnoses:  Lower urinary tract infectious disease        Note:  This document was prepared using Dragon voice recognition software and may include unintentional dictation errors.   Jene EveryKinner, Marinna Blane, MD 03/04/19 [phone]

## 2019-03-04 NOTE — ED Triage Notes (Signed)
Pt to ED reporting LLQ abd pain. No tenderness. Small amount of brown vaginal discharge today but last normal period was last month. Pt denies concern for STD.  Nausea without vomiting or diarrhea. No fevers.

## 2020-04-12 DIAGNOSIS — O0933 Supervision of pregnancy with insufficient antenatal care, third trimester: Secondary | ICD-10-CM | POA: Insufficient documentation

## 2020-04-12 DIAGNOSIS — O1493 Unspecified pre-eclampsia, third trimester: Secondary | ICD-10-CM | POA: Insufficient documentation

## 2020-04-12 HISTORY — DX: Unspecified pre-eclampsia, third trimester: O14.93

## 2020-04-21 DIAGNOSIS — O133 Gestational [pregnancy-induced] hypertension without significant proteinuria, third trimester: Secondary | ICD-10-CM

## 2020-04-21 DIAGNOSIS — O365930 Maternal care for other known or suspected poor fetal growth, third trimester, not applicable or unspecified: Secondary | ICD-10-CM

## 2020-04-21 HISTORY — DX: Gestational (pregnancy-induced) hypertension without significant proteinuria, third trimester: O13.3

## 2020-04-21 HISTORY — DX: Maternal care for other known or suspected poor fetal growth, third trimester, not applicable or unspecified: O36.5930

## 2020-05-07 DIAGNOSIS — O1490 Unspecified pre-eclampsia, unspecified trimester: Secondary | ICD-10-CM

## 2020-05-09 DIAGNOSIS — Z9289 Personal history of other medical treatment: Secondary | ICD-10-CM | POA: Insufficient documentation

## 2020-06-20 NOTE — L&D Delivery Note (Addendum)
Delivery Note  First Stage: Labor onset: contractions started around 0100 Augmentation : AROM  Analgesia /Anesthesia intrapartum: epidural AROM at 1646  Second Stage: Complete dilation at 1741 Onset of pushing at 1746 FHR second stage Category II, recurrent variable decels  Delivery of a viable female infant 05/12/2021 at 1748 by Donato Schultz, CNM delivery of fetal head in OA position with restitution to LOA. loose nuchal cord x1 reduced on the perineum;  Anterior then posterior shoulders delivered easily with gentle downward traction. Spontaneous cry and breathing. Baby placed on mom's chest, and attended to by peds.  Cord double clamped after cessation of pulsation, cut by friend  Third Stage: Placenta delivered Tomasa Blase intact with 3 VC @ 1802 Placenta disposition: sent to pathology for spontaneous preterm birth Uterine tone firm / bleeding scant  1st degree laceration identified, no bleeding, approximated Anesthesia for repair: none Repair none Est. Blood Loss (mL): 63ml  Complications: spontaneous preterm labor and birth  Mom to postpartum.  Baby to NICU.  Newborn: Birth Weight: 4lb 0.2oz Apgar Scores: 8, 8 Feeding planned: breast while in the hospital, then switching to bottle

## 2020-07-28 ENCOUNTER — Telehealth: Payer: Self-pay | Admitting: Licensed Clinical Social Worker

## 2020-07-28 NOTE — Telephone Encounter (Signed)
-----   Message from [place] sent at 07/27/2020  9:46 AM EST ----- Regarding: Referral Good morning,  This mom recently expressed some concerns about depression and difficulty coping with stress. She is interested in working with a therapist. I have the newborn enrolled in [facility].  Thank you,  Yolanda Bonine

## 2020-09-07 ENCOUNTER — Ambulatory Visit: Payer: Medicaid Other | Attending: Internal Medicine

## 2020-09-07 ENCOUNTER — Other Ambulatory Visit (HOSPITAL_COMMUNITY): Payer: Self-pay | Admitting: Internal Medicine

## 2020-09-07 DIAGNOSIS — Z23 Encounter for immunization: Secondary | ICD-10-CM

## 2020-09-07 NOTE — Progress Notes (Signed)
   Covid-19 Vaccination Clinic  Name:  Lauren Romero    MRN: [id number] DOB: 02-07-2001  09/07/2020  [name] was observed post Covid-19 immunization for 15 minutes without incident. She was provided with Vaccine Information Sheet and instruction to access the V-Safe system.   [name] was instructed to call 911 with any severe reactions post vaccine: Marland Kitchen Difficulty breathing  . Swelling of face and throat  . A fast heartbeat  . A bad rash all over body  . Dizziness and weakness   Immunizations Administered    Name Date Dose VIS Date Route   PFIZER Comrnaty(Gray TOP) Covid-19 Vaccine 09/07/2020 12:48 PM 0.3 mL 05/28/2020 Intramuscular   Manufacturer: [employer], [employer]   Lot: [id number]   NDC: [phone]

## 2020-12-04 ENCOUNTER — Emergency Department
Admission: EM | Admit: 2020-12-04 | Discharge: 2020-12-04 | Disposition: A | Discharge status: Home / Self Care | Payer: Medicaid Other | Attending: Emergency Medicine | Admitting: Emergency Medicine

## 2020-12-04 ENCOUNTER — Emergency Department: Payer: Medicaid Other

## 2020-12-04 ENCOUNTER — Other Ambulatory Visit: Payer: Self-pay

## 2020-12-04 ENCOUNTER — Encounter: Payer: Self-pay | Admitting: Emergency Medicine

## 2020-12-04 DIAGNOSIS — O26891 Other specified pregnancy related conditions, first trimester: Secondary | ICD-10-CM | POA: Diagnosis not present

## 2020-12-04 DIAGNOSIS — Z3A10 10 weeks gestation of pregnancy: Secondary | ICD-10-CM | POA: Diagnosis not present

## 2020-12-04 DIAGNOSIS — O4690 Antepartum hemorrhage, unspecified, unspecified trimester: Secondary | ICD-10-CM

## 2020-12-04 DIAGNOSIS — O209 Hemorrhage in early pregnancy, unspecified: Secondary | ICD-10-CM | POA: Diagnosis not present

## 2020-12-04 DIAGNOSIS — R109 Unspecified abdominal pain: Secondary | ICD-10-CM | POA: Insufficient documentation

## 2020-12-04 DIAGNOSIS — O2341 Unspecified infection of urinary tract in pregnancy, first trimester: Secondary | ICD-10-CM

## 2020-12-04 LAB — CBC
HCT: 35.2 % — ABNORMAL LOW (ref 36.0–46.0)
Hemoglobin: 12 g/dL (ref 12.0–15.0)
MCH: 29.2 pg (ref 26.0–34.0)
MCHC: 34.1 g/dL (ref 30.0–36.0)
MCV: 85.6 fL (ref 80.0–100.0)
Platelets: 269 10*3/uL (ref 150–400)
RBC: 4.11 MIL/uL (ref 3.87–5.11)
RDW: 12.7 % (ref 11.5–15.5)
WBC: 8.2 10*3/uL (ref 4.0–10.5)
nRBC: 0 % (ref 0.0–0.2)

## 2020-12-04 LAB — URINALYSIS, COMPLETE (UACMP) WITH MICROSCOPIC
Bilirubin Urine: NEGATIVE
Glucose, UA: NEGATIVE mg/dL
Hgb urine dipstick: NEGATIVE
Ketones, ur: 20 mg/dL — AB
Nitrite: NEGATIVE
Protein, ur: NEGATIVE mg/dL
Specific Gravity, Urine: 1.027 (ref 1.005–1.030)
pH: 5 (ref 5.0–8.0)

## 2020-12-04 LAB — COMPREHENSIVE METABOLIC PANEL
ALT: 11 U/L (ref 0–44)
AST: 20 U/L (ref 15–41)
Albumin: 4.3 g/dL (ref 3.5–5.0)
Alkaline Phosphatase: 47 U/L (ref 38–126)
Anion gap: 7 (ref 5–15)
BUN: 13 mg/dL (ref 6–20)
CO2: 25 mmol/L (ref 22–32)
Calcium: 9.6 mg/dL (ref 8.9–10.3)
Chloride: 100 mmol/L (ref 98–111)
Creatinine, Ser: 0.7 mg/dL (ref 0.44–1.00)
GFR, Estimated: >60 mL/min (ref >60)
Glucose, Bld: 123 mg/dL — ABNORMAL HIGH (ref 70–99)
Potassium: 3.5 mmol/L (ref 3.5–5.1)
Sodium: 132 mmol/L — ABNORMAL LOW (ref 135–145)
Total Bilirubin: 1.1 mg/dL (ref 0.3–1.2)
Total Protein: 7.5 g/dL (ref 6.5–8.1)

## 2020-12-04 LAB — LIPASE, BLOOD: Lipase: 25 U/L (ref 11–51)

## 2020-12-04 LAB — HCG, QUANTITATIVE, PREGNANCY: hCG, Beta Chain, Quant, S: 202915 m[IU]/mL — ABNORMAL HIGH (ref <5)

## 2020-12-04 MED ORDER — NITROFURANTOIN MONOHYD MACRO 100 MG PO CAPS: 100.0000 mg | ORAL_CAPSULE | Freq: Two times a day (BID) | ORAL | 0 refills | Status: AC

## 2020-12-04 NOTE — ED Provider Notes (Signed)
[facility] Emergency Department Provider Note  ____________________________________________  Time seen: Approximately 3:37 PM  I have reviewed the triage vital signs and the nursing notes.   HISTORY  Chief Complaint Vaginal Bleeding    HPI Lauren Romero is a 20 y.o. female who presents the emergency department for evaluation of vaginal bleeding in pregnancy.  She states that she is roughly [redacted] weeks pregnant and has had vaginal bleeding x2 days.  She does have some left-sided abdominal cramping as well.  No fevers or chills.  No emesis, diarrhea or constipation.  No dysuria, polyuria, hematuria.  No medications prior to arrival.       Past Medical History:  Diagnosis Date   Anemia    Pregnant due date march 31/2020    Patient Active Problem List   Diagnosis Date Noted   Labor and delivery, indication for care 09/07/2018   First trimester screening     History reviewed. No pertinent surgical history.  Prior to Admission medications   Medication Sig Start Date End Date Taking? Authorizing Provider  acetaminophen (TYLENOL) 325 MG tablet Take 2 tablets (650 mg total) by mouth every 4 (four) hours as needed for mild pain or moderate pain. 09/09/18   Genia Del, CNM  COVID-19 mRNA Vac-TriS, Pfizer, SUSP injection USE AS DIRECTED 09/07/20 09/07/21  Judyann Munson, MD    Allergies Patient has no known allergies.  Family History  Problem Relation Age of Onset   Diabetes Maternal Grandmother    Hypertension Maternal Grandmother    Diabetes Paternal Grandmother    Hypertension Paternal Grandmother     Social History Social History   Tobacco Use   Smoking status: Never   Smokeless tobacco: Never  Vaping Use   Vaping Use: Never used  Substance Use Topics   Alcohol use: No   Drug use: Not Currently     Review of Systems  Constitutional: No fever/chills Eyes: No visual changes. No discharge ENT: No upper respiratory  complaints. Cardiovascular: no chest pain. Respiratory: no cough. No SOB. Gastrointestinal: No abdominal pain.  No nausea, no vomiting.  No diarrhea.  No constipation. Genitourinary: Negative for dysuria. No hematuria.  Vaginal bleeding in the setting of pregnancy Musculoskeletal: Negative for musculoskeletal pain. Skin: Negative for rash, abrasions, lacerations, ecchymosis. Neurological: Negative for headaches, focal weakness or numbness.  10 System ROS otherwise negative.  ____________________________________________   PHYSICAL EXAM:  VITAL SIGNS: ED Triage Vitals  Enc Vitals Group     BP 12/04/20 1202 132/80     Pulse Rate 12/04/20 1202 92     Resp 12/04/20 1202 17     Temp 12/04/20 1202 99.1 F (37.3 C)     Temp Source 12/04/20 1202 Oral     SpO2 12/04/20 1202 98 %     Weight 12/04/20 1209 134 lb 14.7 oz (61.2 kg)     Height 12/04/20 1209 5\' 4"  (1.626 m)     Head Circumference --      Peak Flow --      Pain Score --      Pain Loc --      Pain Edu? --      Excl. in GC? --      Constitutional: Alert and oriented. Well appearing and in no acute distress. Eyes: Conjunctivae are normal. PERRL. EOMI. Head: Atraumatic. ENT:      Ears:       Nose: No congestion/rhinnorhea.      Mouth/Throat: Mucous membranes are moist.  Neck: No  stridor.    Cardiovascular: Normal rate, regular rhythm. Normal S1 and S2.  Good peripheral circulation. Respiratory: Normal respiratory effort without tachypnea or retractions. Lungs CTAB. Good air entry to the bases with no decreased or absent breath sounds. Gastrointestinal: Bowel sounds 4 quadrants. Soft and nontender to palpation. No guarding or rigidity. No palpable masses. No distention. No CVA tenderness. Musculoskeletal: Full range of motion to all extremities. No gross deformities appreciated. Neurologic:  Normal speech and language. No gross focal neurologic deficits are appreciated.  Skin:  Skin is warm, dry and intact. No rash  noted. Psychiatric: Mood and affect are normal. Speech and behavior are normal. Patient exhibits appropriate insight and judgement.   ____________________________________________   LABS (all labs ordered are listed, but only abnormal results are displayed)  Labs Reviewed  COMPREHENSIVE METABOLIC PANEL - Abnormal; Notable for the following components:      Result Value   Sodium 132 (*)    Glucose, Bld 123 (*)    All other components within normal limits  CBC - Abnormal; Notable for the following components:   HCT 35.2 (*)    All other components within normal limits  URINALYSIS, COMPLETE (UACMP) WITH MICROSCOPIC - Abnormal; Notable for the following components:   Color, Urine YELLOW (*)    APPearance CLEAR (*)    Ketones, ur 20 (*)    Leukocytes,Ua MODERATE (*)    Bacteria, UA RARE (*)    All other components within normal limits  HCG, QUANTITATIVE, PREGNANCY - Abnormal; Notable for the following components:   hCG, Beta Chain, Quant, S 202,915 (*)    All other components within normal limits  LIPASE, BLOOD   ____________________________________________  EKG   ____________________________________________  RADIOLOGY I personally viewed and evaluated these images as part of my medical decision making, as well as reviewing the written report by the radiologist.  ED Provider Interpretation: Single intrauterine fetus measuring 10 weeks and 3 days by measurement.  No subchorionic hemorrhage.  US OB Comp Less 14 Wks  Result Date: 12/04/2020 CLINICAL DATA:  Ten weeks pregnant with vaginal bleeding. EXAM: OBSTETRIC <14 WK [place] AND TRANSVAGINAL OB US TECHNIQUE: Both transabdominal and transvaginal ultrasound examinations were performed for complete evaluation of the gestation as well as the maternal uterus, adnexal regions, and pelvic cul-de-sac. Transvaginal technique was performed to assess early pregnancy. COMPARISON:  None. FINDINGS: Intrauterine gestational sac: Present Yolk sac:   Present Embryo:  Present Cardiac Activity: Present Heart Rate: 166 bpm CRL:  36 mm   10 w   3 d                  [place] EDC: 06/29/2021 Subchorionic hemorrhage:  None visualized. Maternal uterus/adnexae: Both ovaries are normal. Corpus luteum cyst noted on the right. No free pelvic fluid. IMPRESSION: 1. Single living intrauterine fetus estimated at 10 weeks and 3 days gestation. 2. No subchorionic hemorrhage identified. 3. Normal ovaries. Electronically Signed   By: Rudie Meyer M.D.   On: 12/04/2020 14:27    ____________________________________________    PROCEDURES  Procedure(s) performed:    Procedures    Medications - No data to display   ____________________________________________   INITIAL IMPRESSION / ASSESSMENT AND PLAN / ED COURSE  Pertinent labs & imaging results that were available during my care of the patient were reviewed by me and considered in my medical decision making (see chart for details).  Review of the [place] CSRS was performed in accordance of the [facility] prior to dispensing any controlled  drugs.           Patient's diagnosis is consistent with vaginal bleeding in pregnancy, UTI and pregnancy.  Patient presented to the emergency department complaining of vaginal bleeding and cramping.  She denied any urinary symptoms to accompany her vaginal bleeding.  Ultrasound revealed single intrauterine pregnancy with no evidence of subchorionic hemorrhage.  Labs are overall stable and reassuring.  Patient did have white blood cells and bacteria and her urine and will be treated for UTI as such.  Patient is referred to OB/GYN at this time.  No indication for further work-up. Patient is given ED precautions to return to the ED for any worsening or new symptoms.     ____________________________________________  FINAL CLINICAL IMPRESSION(S) / ED DIAGNOSES  Final diagnoses:  Vaginal bleeding in pregnancy  Urinary tract infection in mother during first trimester of pregnancy       NEW MEDICATIONS STARTED DURING THIS VISIT:  ED Discharge Orders     None           This chart was dictated using voice recognition software/Dragon. Despite best efforts to proofread, errors can occur which can change the meaning. Any change was purely unintentional.    Racheal Patches, PA-C 12/04/20 1559    Jene Every, MD 12/05/20 [phone]

## 2020-12-04 NOTE — ED Notes (Signed)
See triage note. Pt here with bleeding x2 days and HA but denies any other symptoms. Para 3 for pt.

## 2020-12-04 NOTE — ED Triage Notes (Signed)
Pt reports she is [redacted] weeks pregnant and has experienced vaginal bleeding over the last 2 days. Pt reports scant blood, no clots and c/o abdominal cramping.

## 2020-12-04 NOTE — ED Notes (Signed)
ED Provider at bedside. 

## 2021-03-09 ENCOUNTER — Other Ambulatory Visit (HOSPITAL_COMMUNITY): Payer: Self-pay | Admitting: Family Medicine

## 2021-03-09 ENCOUNTER — Other Ambulatory Visit: Payer: Self-pay | Admitting: Family Medicine

## 2021-03-09 DIAGNOSIS — Z3482 Encounter for supervision of other normal pregnancy, second trimester: Secondary | ICD-10-CM

## 2021-03-10 ENCOUNTER — Ambulatory Visit
Admission: RE | Admit: 2021-03-10 | Discharge: 2021-03-10 | Disposition: A | Discharge status: Home / Self Care | Payer: Medicaid Other | Source: Ambulatory Visit | Attending: Family Medicine | Admitting: Family Medicine

## 2021-03-10 ENCOUNTER — Other Ambulatory Visit: Payer: Self-pay

## 2021-03-10 DIAGNOSIS — Z3482 Encounter for supervision of other normal pregnancy, second trimester: Secondary | ICD-10-CM | POA: Diagnosis not present

## 2021-03-26 ENCOUNTER — Encounter: Payer: Medicaid Other | Admitting: Obstetrics and Gynecology

## 2021-03-26 ENCOUNTER — Encounter: Payer: Self-pay | Admitting: Obstetrics and Gynecology

## 2021-03-29 ENCOUNTER — Other Ambulatory Visit: Payer: Self-pay

## 2021-03-29 ENCOUNTER — Observation Stay
Admission: EM | Admit: 2021-03-29 | Discharge: 2021-03-29 | Disposition: A | Discharge status: Home / Self Care | Payer: Medicaid Other | Attending: Obstetrics and Gynecology | Admitting: Obstetrics and Gynecology

## 2021-03-29 DIAGNOSIS — Z3A26 26 weeks gestation of pregnancy: Secondary | ICD-10-CM | POA: Insufficient documentation

## 2021-03-29 DIAGNOSIS — O26892 Other specified pregnancy related conditions, second trimester: Principal | ICD-10-CM | POA: Insufficient documentation

## 2021-03-29 DIAGNOSIS — R109 Unspecified abdominal pain: Secondary | ICD-10-CM | POA: Diagnosis present

## 2021-03-29 DIAGNOSIS — R1930 Abdominal rigidity, unspecified site: Secondary | ICD-10-CM | POA: Insufficient documentation

## 2021-03-29 HISTORY — DX: Gestational (pregnancy-induced) hypertension without significant proteinuria, unspecified trimester: O13.9

## 2021-03-29 NOTE — Discharge Summary (Signed)
Patient ID: Lauren Romero MRN: [id number] DOB/AGE: [birth year] 20 y.o.  Admit date: 03/29/2021 Discharge date: 03/29/2021  Admission Diagnoses: 20yo G3P2 at [redacted]w[redacted]d presents with belly tightening that woke her up around 10pm last night. They stopped around midnight and she went back to sleep. The tightening woke her up again at 4 am. Denies VB or LOF. No recent IC.   Discharge Diagnoses: BH  Factors complicating pregnancy: Closely spaced pregnancy  Prenatal Procedures: none  Consults: None  Significant Diagnostic Studies:  No results found for this or any previous visit (from the past 168 hour(s)).  Treatments: oral hydration  Hospital Course:  This is a 20 y.o. [id number] with IUP at [redacted]w[redacted]d seen for belly tightening, cervical exam deferred.  No leaking of fluid and no bleeding.  She was observed, fetal heart rate monitoring remained reassuring, and she had no signs/symptoms of preterm labor or other maternal-fetal concerns.  Reassurance given and encouraged to rest and hydrate today.  She was deemed stable for discharge to home with outpatient follow up.  Discharge Physical Exam:  BP 122/60 (BP Location: Right Arm)   Pulse 76   Temp 97.8 F (36.6 C) (Oral)   Resp 16   Ht 5\' 4"  (1.626 m)   Wt 52.6 kg   LMP 09/22/2020 (Exact Date)   SpO2 100%   BMI 19.91 kg/m   General: NAD CV: RRR Pulm: nl effort ABD: s/nd/nt, gravid DVT Evaluation: LE non-ttp, no evidence of DVT on exam.  NST: FHR baseline: 150 bpm Variability: moderate Category/reactivity: reassuring for GA TOCO: quiet SVE: deferred      Discharge Condition: Stable  Disposition: Discharge disposition: 01-Home or Self Care       Allergies as of 03/29/2021   No Known Allergies      Medication List     STOP taking these medications    acetaminophen 325 MG tablet Commonly known as: Tylenol   Pfizer-BioNT COVID-19 Vac-TriS Susp injection Generic drug: COVID-19 mRNA Vac-TriS 05/29/2021)          Signed:  Proofreader, CNM 03/29/2021 9:22 AM

## 2021-03-29 NOTE — OB Triage Note (Signed)
Pt presents stating she woke up around 10pm last night with ctx. They stopped around midnight and she went back to sleep. Pt states she woke up at 4 am with the same ctx pain and they haven't stopped so she came in. Pt unable to tell me how apart the pains are. Pt denies bleeding or LOF. No recent intercourse. Pt reports drinking adequate amounts of fluid. Pt denies any urinary symptoms. Pt reports positive fetal movement. VSS. Will continue to monitor.

## 2021-03-29 NOTE — Discharge Summary (Signed)
Rn reviewed discharge instructions with patient. Gave patient opportunity for questions. All questions answered at this time. Pt verbalized understanding. Pt discharged home 

## 2021-05-01 ENCOUNTER — Other Ambulatory Visit: Payer: Self-pay

## 2021-05-01 ENCOUNTER — Encounter: Payer: Self-pay | Admitting: Obstetrics and Gynecology

## 2021-05-01 ENCOUNTER — Observation Stay
Admission: EM | Admit: 2021-05-01 | Discharge: 2021-05-01 | Disposition: A | Discharge status: Home / Self Care | Payer: Medicaid Other | Attending: Obstetrics and Gynecology | Admitting: Obstetrics and Gynecology

## 2021-05-01 DIAGNOSIS — O26893 Other specified pregnancy related conditions, third trimester: Principal | ICD-10-CM | POA: Insufficient documentation

## 2021-05-01 DIAGNOSIS — O133 Gestational [pregnancy-induced] hypertension without significant proteinuria, third trimester: Secondary | ICD-10-CM | POA: Diagnosis not present

## 2021-05-01 DIAGNOSIS — Z3A31 31 weeks gestation of pregnancy: Secondary | ICD-10-CM | POA: Diagnosis not present

## 2021-05-01 DIAGNOSIS — R109 Unspecified abdominal pain: Secondary | ICD-10-CM | POA: Diagnosis present

## 2021-05-01 DIAGNOSIS — O26899 Other specified pregnancy related conditions, unspecified trimester: Secondary | ICD-10-CM | POA: Diagnosis present

## 2021-05-01 DIAGNOSIS — R1030 Lower abdominal pain, unspecified: Secondary | ICD-10-CM | POA: Insufficient documentation

## 2021-05-01 DIAGNOSIS — O2343 Unspecified infection of urinary tract in pregnancy, third trimester: Secondary | ICD-10-CM | POA: Diagnosis not present

## 2021-05-01 LAB — URINALYSIS, COMPLETE (UACMP) WITH MICROSCOPIC
Bilirubin Urine: NEGATIVE
Glucose, UA: NEGATIVE mg/dL
Hgb urine dipstick: NEGATIVE
Ketones, ur: 5 mg/dL — AB
Nitrite: NEGATIVE
Protein, ur: 100 mg/dL — AB
Specific Gravity, Urine: 1.024 (ref 1.005–1.030)
WBC, UA: >50 WBC/hpf — ABNORMAL HIGH (ref 0–5)
pH: 5 (ref 5.0–8.0)

## 2021-05-01 MED ORDER — AMOXICILLIN-POT CLAVULANATE 875-125 MG PO TABS: 1.0000 | ORAL_TABLET | Freq: Two times a day (BID) | ORAL | 0 refills | Status: AC

## 2021-05-01 MED ORDER — ACETAMINOPHEN 500 MG PO TABS: 1000.0000 mg | ORAL_TABLET | Freq: Four times a day (QID) | ORAL | Status: DC | PRN

## 2021-05-01 MED ORDER — CALCIUM CARBONATE ANTACID 500 MG PO CHEW: 2.0000 | CHEWABLE_TABLET | ORAL | Status: DC | PRN

## 2021-05-01 NOTE — OB Triage Note (Signed)
Pt Lauren Romero 20 y.o. presents to labor and delivery triage reporting dizziness, headache, and abdominal pain  . Pt is a G3P1102 at [redacted]w[redacted]d . Pt denies signs and symptons consistent with rupture of membranes or active vaginal bleeding. Pt denies contractions and states positive fetal movement. 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 Pt reports checking out two customers at work before having to sit down, reports headache 3/10, denies RUQ pain or blurry vision. Pt also reports having the urge to urinate but unable to go. External FM and TOCO applied to non-tender abdomen and assessing. Initial FHR 145. Vital signs obtained and within normal limits. Provider notified of pt.

## 2021-05-01 NOTE — Discharge Summary (Signed)
Lauren Romero is a 20 y.o. female. She is at [redacted]w[redacted]d gestation. Patient's last menstrual period was 09/22/2020 (exact date). Estimated Date of Delivery: 06/29/21  [facility] care [facility]  Chief complaint: cramping, mild headache  HPI: [name] presents to L&D with complaints of lower abdominal cramping that has been off and on over the past couple of weeks.  Also reports mild, dull HA that started today.  Has not yet taken anything for it.    Factors complicating pregnancy: History of preeclampsia  History of preterm birth d/t preeclampsia   S: Resting comfortably. no CTX, no VB.no LOF,  Active fetal movement.   Maternal Medical History:  Past Medical Hx:  has a past medical history of Anemia, Pregnancy induced hypertension, and Pregnant (due date march 31/2020).    Past Surgical Hx:  has no past surgical history on file.   No Known Allergies   Prior to Admission medications   Medication Sig Start Date End Date Taking? Authorizing Provider  amoxicillin-clavulanate (AUGMENTIN) 875-125 MG tablet Take 1 tablet by mouth 2 (two) times daily for 7 days. 05/01/21 05/08/21 Yes Gustavo Lah, CNM    Social History: She  reports that she has never smoked. She has never used smokeless tobacco. She reports that she does not currently use alcohol. She reports that she does not currently use drugs.  Family History: family history includes Diabetes in her maternal grandmother and paternal grandmother; Hypertension in her maternal grandmother and paternal grandmother.   Review of Systems: A full review of systems was performed and negative except as noted in the HPI.    O:  BP (!) 113/55   Pulse 77   Temp 98.1 F (36.7 C) (Oral)   Resp 18   LMP 09/22/2020 (Exact Date)  Results for orders placed or performed during the hospital encounter of 05/01/21 (from the past 48 hour(s))  Urinalysis, Complete w Microscopic Urine, Clean Catch   Collection Time: 05/01/21 10:00 AM  Result  Value Ref Range   Color, Urine AMBER (A) YELLOW   APPearance CLOUDY (A) CLEAR   Specific Gravity, Urine 1.024 1.005 - 1.030   pH 5.0 5.0 - 8.0   Glucose, UA NEGATIVE NEGATIVE mg/dL   Hgb urine dipstick NEGATIVE NEGATIVE   Bilirubin Urine NEGATIVE NEGATIVE   Ketones, ur 5 (A) NEGATIVE mg/dL   Protein, ur 824 (A) NEGATIVE mg/dL   Nitrite NEGATIVE NEGATIVE   Leukocytes,Ua LARGE (A) NEGATIVE   RBC / HPF 6-10 0 - 5 RBC/hpf   WBC, UA >50 (H) 0 - 5 WBC/hpf   Bacteria, UA RARE (A) NONE SEEN   Squamous Epithelial / LPF 11-20 0 - 5   Mucus PRESENT    Hyaline Casts, UA PRESENT       Constitutional: NAD, AAOx3  CV: RRR PULM: nl respiratory effort, CTABL Abd: gravid, non-tender, non-distended, soft  Ext: Non-tender, Nonedmeatous Psych: mood appropriate, speech normal Pelvic : deferred  Fetal Monitor: Baseline: 140 bpm Variability: moderate Accels: Present Decels: none Toco: occasional with uterine irritability   Category: I   Assessment: 20 y.o. [redacted]w[redacted]d here for antenatal surveillance during pregnancy.  Principle diagnosis: Dehydration in pregnancy, UTI in pregnancy    Plan: Labor: not present.  Fetal Wellbeing: Reassuring Cat 1 tracing. Reactive NST  Recommend to increase PO hydration and reviewed risks of dehydration in pregnancy  Urine culture pending - Augmentin given for probable UTI  D/c home stable, precautions reviewed, follow-up as scheduled.  Instructed to make appointment ASAP to  continue routine prenatal care.   ----- Margaretmary Eddy, CNM Certified Nurse Midwife [place]  [facility]

## 2021-05-01 NOTE — Progress Notes (Signed)
Pt discharged home per A.Mackie,CNM order.  Pt stable and ambulatory and an After Visit Summary was printed and given to the patient. Discharge education completed with patient/family including follow up instructions, appointments, and medication list. Pt received labor and bleeding precautions. Antibiotics sent to the pharmacy on file. Pt instructed to make a follow up routine appointment with [facility] on Monday. Patient able to verbalize understanding, all questions fully answered upon discharge. Patient instructed to return to ED, call 911, or call MD for any changes in condition. Pt discharged home via personal vehicle.

## 2021-05-02 LAB — URINE CULTURE: Culture: NO GROWTH

## 2021-05-12 ENCOUNTER — Other Ambulatory Visit: Payer: Self-pay

## 2021-05-12 ENCOUNTER — Inpatient Hospital Stay
Admission: EM | Admit: 2021-05-12 | Discharge: 2021-05-13 | DRG: 807 | Disposition: A | Discharge status: Home / Self Care | Payer: Medicaid Other | Attending: Certified Nurse Midwife | Admitting: Certified Nurse Midwife

## 2021-05-12 ENCOUNTER — Inpatient Hospital Stay: Payer: Medicaid Other | Admitting: Anesthesiology

## 2021-05-12 ENCOUNTER — Encounter: Payer: Self-pay | Admitting: Obstetrics and Gynecology

## 2021-05-12 DIAGNOSIS — Z20822 Contact with and (suspected) exposure to covid-19: Secondary | ICD-10-CM | POA: Diagnosis present

## 2021-05-12 DIAGNOSIS — Z3A33 33 weeks gestation of pregnancy: Secondary | ICD-10-CM | POA: Diagnosis not present

## 2021-05-12 DIAGNOSIS — O4700 False labor before 37 completed weeks of gestation, unspecified trimester: Secondary | ICD-10-CM | POA: Diagnosis present

## 2021-05-12 DIAGNOSIS — O09293 Supervision of pregnancy with other poor reproductive or obstetric history, third trimester: Secondary | ICD-10-CM | POA: Insufficient documentation

## 2021-05-12 LAB — URINALYSIS, COMPLETE (UACMP) WITH MICROSCOPIC
Bilirubin Urine: NEGATIVE
Glucose, UA: NEGATIVE mg/dL
Hgb urine dipstick: NEGATIVE
Ketones, ur: 80 mg/dL — AB
Nitrite: NEGATIVE
Protein, ur: 30 mg/dL — AB
Specific Gravity, Urine: 1.028 (ref 1.005–1.030)
WBC, UA: >50 WBC/hpf — ABNORMAL HIGH (ref 0–5)
pH: 6 (ref 5.0–8.0)

## 2021-05-12 LAB — RAPID HIV SCREEN (HIV 1/2 AB+AG)
HIV 1/2 Antibodies: NONREACTIVE
HIV-1 P24 Antigen - HIV24: NONREACTIVE

## 2021-05-12 LAB — URINE DRUG SCREEN, QUALITATIVE (ARMC ONLY)
Amphetamines, Ur Screen: NOT DETECTED
Barbiturates, Ur Screen: NOT DETECTED
Benzodiazepine, Ur Scrn: NOT DETECTED
Cannabinoid 50 Ng, Ur ~~LOC~~: POSITIVE — AB
Cocaine Metabolite,Ur ~~LOC~~: NOT DETECTED
MDMA (Ecstasy)Ur Screen: NOT DETECTED
Methadone Scn, Ur: NOT DETECTED
Opiate, Ur Screen: NOT DETECTED
Phencyclidine (PCP) Ur S: NOT DETECTED
Tricyclic, Ur Screen: NOT DETECTED

## 2021-05-12 LAB — CBC
HCT: 30.2 % — ABNORMAL LOW (ref 36.0–46.0)
Hemoglobin: 10.1 g/dL — ABNORMAL LOW (ref 12.0–15.0)
MCH: 29.4 pg (ref 26.0–34.0)
MCHC: 33.4 g/dL (ref 30.0–36.0)
MCV: 87.8 fL (ref 80.0–100.0)
Platelets: 252 10*3/uL (ref 150–400)
RBC: 3.44 MIL/uL — ABNORMAL LOW (ref 3.87–5.11)
RDW: 13.2 % (ref 11.5–15.5)
WBC: 11 10*3/uL — ABNORMAL HIGH (ref 4.0–10.5)
nRBC: 0 % (ref 0.0–0.2)

## 2021-05-12 LAB — TYPE AND SCREEN
ABO/RH(D): B POS
Antibody Screen: NEGATIVE

## 2021-05-12 LAB — RESP PANEL BY RT-PCR (FLU A&B, COVID) ARPGX2
Influenza A by PCR: NEGATIVE
Influenza B by PCR: NEGATIVE
SARS Coronavirus 2 by RT PCR: NEGATIVE

## 2021-05-12 LAB — CHLAMYDIA/NGC RT PCR (ARMC ONLY)
Chlamydia Tr: NOT DETECTED
N gonorrhoeae: NOT DETECTED

## 2021-05-12 LAB — GROUP B STREP BY PCR: Group B strep by PCR: NEGATIVE

## 2021-05-12 LAB — WET PREP, GENITAL
Clue Cells Wet Prep HPF POC: NONE SEEN
Sperm: NONE SEEN
Trich, Wet Prep: NONE SEEN
Yeast Wet Prep HPF POC: NONE SEEN

## 2021-05-12 MED ORDER — SENNOSIDES-DOCUSATE SODIUM 8.6-50 MG PO TABS
2.0000 | ORAL_TABLET | Freq: Every day | ORAL | Status: DC
  Administered 2021-05-13: 2 via ORAL
  Filled 2021-05-12: qty 2

## 2021-05-12 MED ORDER — IBUPROFEN 600 MG PO TABS
600.0000 mg | ORAL_TABLET | Freq: Four times a day (QID) | ORAL | Status: DC
  Administered 2021-05-12 – 2021-05-13 (×2): 600 mg via ORAL
  Filled 2021-05-12 (×2): qty 1

## 2021-05-12 MED ORDER — FENTANYL-BUPIVACAINE-NACL 0.5-0.125-0.9 MG/250ML-% EP SOLN
12.0000 mL/h | EPIDURAL | Status: DC | PRN
  Administered 2021-05-12: 12 mL/h via EPIDURAL

## 2021-05-12 MED ORDER — TERBUTALINE SULFATE 1 MG/ML IJ SOLN
0.2500 mg | INTRAMUSCULAR | Status: DC | PRN
  Administered 2021-05-12: 0.25 mg via SUBCUTANEOUS
  Filled 2021-05-12: qty 1

## 2021-05-12 MED ORDER — TETANUS-DIPHTH-ACELL PERTUSSIS 5-2.5-18.5 LF-MCG/0.5 IM SUSY
0.5000 mL | PREFILLED_SYRINGE | Freq: Once | INTRAMUSCULAR | Status: DC
  Filled 2021-05-12: qty 0.5

## 2021-05-12 MED ORDER — LACTATED RINGERS IV SOLN: 500.0000 mL | Freq: Once | INTRAVENOUS | Status: DC

## 2021-05-12 MED ORDER — PHENYLEPHRINE 40 MCG/ML (10ML) SYRINGE FOR IV PUSH (FOR BLOOD PRESSURE SUPPORT): 80.0000 ug | PREFILLED_SYRINGE | INTRAVENOUS | Status: DC | PRN

## 2021-05-12 MED ORDER — EPHEDRINE 5 MG/ML INJ: 10.0000 mg | INTRAVENOUS | Status: DC | PRN

## 2021-05-12 MED ORDER — TERBUTALINE SULFATE 1 MG/ML IJ SOLN: 0.2500 mg | INTRAMUSCULAR | Status: DC | PRN

## 2021-05-12 MED ORDER — LACTATED RINGERS IV BOLUS
1000.0000 mL | Freq: Once | INTRAVENOUS | Status: AC
  Administered 2021-05-12: 1000 mL via INTRAVENOUS

## 2021-05-12 MED ORDER — ONDANSETRON HCL 4 MG PO TABS
4.0000 mg | ORAL_TABLET | ORAL | Status: DC | PRN
  Filled 2021-05-12: qty 1

## 2021-05-12 MED ORDER — ACETAMINOPHEN 325 MG PO TABS
650.0000 mg | ORAL_TABLET | ORAL | Status: DC | PRN
  Administered 2021-05-12 – 2021-05-13 (×2): 650 mg via ORAL
  Filled 2021-05-12 (×3): qty 2

## 2021-05-12 MED ORDER — DIPHENHYDRAMINE HCL 25 MG PO CAPS: 25.0000 mg | ORAL_CAPSULE | Freq: Four times a day (QID) | ORAL | Status: DC | PRN

## 2021-05-12 MED ORDER — MEDROXYPROGESTERONE ACETATE 150 MG/ML IM SUSP
150.0000 mg | INTRAMUSCULAR | Status: DC | PRN
  Filled 2021-05-12: qty 1

## 2021-05-12 MED ORDER — NIFEDIPINE 10 MG PO CAPS: 10.0000 mg | ORAL_CAPSULE | Freq: Four times a day (QID) | ORAL | Status: DC

## 2021-05-12 MED ORDER — LIDOCAINE HCL (PF) 1 % IJ SOLN
INTRAMUSCULAR | Status: AC
  Filled 2021-05-12: qty 30

## 2021-05-12 MED ORDER — WITCH HAZEL-GLYCERIN EX PADS
1.0000 "application " | MEDICATED_PAD | CUTANEOUS | Status: DC | PRN
  Administered 2021-05-12: 1 via TOPICAL
  Filled 2021-05-12: qty 100

## 2021-05-12 MED ORDER — BENZOCAINE-MENTHOL 20-0.5 % EX AERO
1.0000 "application " | INHALATION_SPRAY | CUTANEOUS | Status: DC | PRN
  Administered 2021-05-12: 1 via TOPICAL
  Filled 2021-05-12: qty 56

## 2021-05-12 MED ORDER — COCONUT OIL OIL
1.0000 "application " | TOPICAL_OIL | Status: DC | PRN
  Filled 2021-05-12: qty 120

## 2021-05-12 MED ORDER — ZOLPIDEM TARTRATE 5 MG PO TABS: 5.0000 mg | ORAL_TABLET | Freq: Every evening | ORAL | Status: DC | PRN

## 2021-05-12 MED ORDER — BETAMETHASONE SOD PHOS & ACET 6 (3-3) MG/ML IJ SUSP
12.0000 mg | INTRAMUSCULAR | Status: DC
  Administered 2021-05-12: 12 mg via INTRAMUSCULAR
  Filled 2021-05-12: qty 5

## 2021-05-12 MED ORDER — NIFEDIPINE 10 MG PO CAPS
30.0000 mg | ORAL_CAPSULE | Freq: Once | ORAL | Status: AC
  Administered 2021-05-12: 30 mg via ORAL
  Filled 2021-05-12: qty 3

## 2021-05-12 MED ORDER — OXYTOCIN-SODIUM CHLORIDE 30-0.9 UT/500ML-% IV SOLN
INTRAVENOUS | Status: AC
  Filled 2021-05-12: qty 500

## 2021-05-12 MED ORDER — CALCIUM CARBONATE ANTACID 500 MG PO CHEW: 2.0000 | CHEWABLE_TABLET | ORAL | Status: DC | PRN

## 2021-05-12 MED ORDER — LACTATED RINGERS IV SOLN
500.0000 mL | INTRAVENOUS | Status: DC | PRN
Start: 2021-05-12 — End: 2021-05-12

## 2021-05-12 MED ORDER — OXYTOCIN 10 UNIT/ML IJ SOLN
INTRAMUSCULAR | Status: AC
  Filled 2021-05-12: qty 2

## 2021-05-12 MED ORDER — DIPHENHYDRAMINE HCL 50 MG/ML IJ SOLN: 12.5000 mg | INTRAMUSCULAR | Status: DC | PRN

## 2021-05-12 MED ORDER — DIBUCAINE (PERIANAL) 1 % EX OINT: 1.0000 "application " | TOPICAL_OINTMENT | CUTANEOUS | Status: DC | PRN

## 2021-05-12 MED ORDER — BUPIVACAINE HCL (PF) 0.25 % IJ SOLN
INTRAMUSCULAR | Status: DC | PRN
  Administered 2021-05-12: 2 mL via EPIDURAL
  Administered 2021-05-12: 3 mL via EPIDURAL

## 2021-05-12 MED ORDER — PRENATAL MULTIVITAMIN CH: 1.0000 | ORAL_TABLET | Freq: Every day | ORAL | Status: DC

## 2021-05-12 MED ORDER — OXYTOCIN-SODIUM CHLORIDE 30-0.9 UT/500ML-% IV SOLN
2.5000 [IU]/h | INTRAVENOUS | Status: DC
  Administered 2021-05-12: 2.5 [IU]/h via INTRAVENOUS

## 2021-05-12 MED ORDER — FENTANYL-BUPIVACAINE-NACL 0.5-0.125-0.9 MG/250ML-% EP SOLN
EPIDURAL | Status: AC
  Filled 2021-05-12: qty 250

## 2021-05-12 MED ORDER — SIMETHICONE 80 MG PO CHEW: 80.0000 mg | CHEWABLE_TABLET | ORAL | Status: DC | PRN

## 2021-05-12 MED ORDER — LIDOCAINE HCL (PF) 1 % IJ SOLN: 30.0000 mL | INTRAMUSCULAR | Status: DC | PRN

## 2021-05-12 MED ORDER — LIDOCAINE HCL (PF) 1 % IJ SOLN
INTRAMUSCULAR | Status: DC | PRN
  Administered 2021-05-12: 3 mL

## 2021-05-12 MED ORDER — FENTANYL CITRATE (PF) 100 MCG/2ML IJ SOLN
50.0000 ug | INTRAMUSCULAR | Status: DC | PRN
  Administered 2021-05-12: 50 ug via INTRAVENOUS
  Filled 2021-05-12: qty 2

## 2021-05-12 MED ORDER — LACTATED RINGERS IV SOLN: INTRAVENOUS | Status: DC

## 2021-05-12 MED ORDER — ONDANSETRON HCL 4 MG/2ML IJ SOLN: 4.0000 mg | Freq: Four times a day (QID) | INTRAMUSCULAR | Status: DC | PRN

## 2021-05-12 MED ORDER — ACETAMINOPHEN 500 MG PO TABS
1000.0000 mg | ORAL_TABLET | Freq: Four times a day (QID) | ORAL | Status: DC | PRN
  Administered 2021-05-12: 1000 mg via ORAL
  Filled 2021-05-12: qty 2

## 2021-05-12 MED ORDER — ONDANSETRON HCL 4 MG/2ML IJ SOLN: 4.0000 mg | INTRAMUSCULAR | Status: DC | PRN

## 2021-05-12 MED ORDER — LIDOCAINE-EPINEPHRINE (PF) 1.5 %-1:200000 IJ SOLN
INTRAMUSCULAR | Status: DC | PRN
  Administered 2021-05-12: 3 mL via EPIDURAL

## 2021-05-12 MED ORDER — MISOPROSTOL 200 MCG PO TABS
ORAL_TABLET | ORAL | Status: AC
  Filled 2021-05-12: qty 4

## 2021-05-12 MED ORDER — SODIUM CHLORIDE 0.9 % IV SOLN
1.0000 g | INTRAVENOUS | Status: DC
  Administered 2021-05-12: 1 g via INTRAVENOUS
  Filled 2021-05-12: qty 1000

## 2021-05-12 MED ORDER — SOD CITRATE-CITRIC ACID 500-334 MG/5ML PO SOLN: 30.0000 mL | ORAL | Status: DC | PRN

## 2021-05-12 MED ORDER — AMMONIA AROMATIC IN INHA
RESPIRATORY_TRACT | Status: AC
  Filled 2021-05-12: qty 10

## 2021-05-12 MED ORDER — OXYTOCIN BOLUS FROM INFUSION
333.0000 mL | Freq: Once | INTRAVENOUS | Status: AC
  Administered 2021-05-12: 333 mL via INTRAVENOUS

## 2021-05-12 MED ORDER — SODIUM CHLORIDE 0.9 % IV SOLN
2.0000 g | Freq: Once | INTRAVENOUS | Status: AC
  Administered 2021-05-12: 2 g via INTRAVENOUS
  Filled 2021-05-12: qty 2000

## 2021-05-12 NOTE — Progress Notes (Signed)
Labor Progress Note  Lauren Romero is a 20 y.o. [phone] at [redacted]w[redacted]d by LMP admitted for Preterm labor  Subjective: She is resting comfortably after her epidural  Objective: BP 114/60   Pulse 75   Temp 98.2 F (36.8 C)   Resp 18   LMP 09/22/2020 (Exact Date)   SpO2 100%  Notable VS details: reviewed  Fetal Assessment: FHT:  FHR: 145 bpm, variability: moderate,  accelerations:  Present,  decelerations:  Absent Category/reactivity:  Category I UC:   regular, every 3-4 minutes SVE:   Bloody show noted Dilation: 8cm  Effacement: 90%  Station:  0  Consistency: soft  Position: anterior  Membrane status: AROM Amniotic color: clear  Labs: Lab Results  Component Value Date   WBC 11.0 (H) 05/12/2021   HGB 10.1 (L) 05/12/2021   HCT 30.2 (L) 05/12/2021   MCV 87.8 05/12/2021   PLT 252 05/12/2021    Assessment / Plan: Spontaneous labor, progressing normally  Labor: cervical change, AROM. Preeclampsia:  no signs or symptoms of toxicity Fetal Wellbeing:  Category I Pain Control:  Epidural I/D:  n/a Anticipated MOD:  NSVD  Janyce Llanos, CNM 05/12/2021, 4:46 PM

## 2021-05-12 NOTE — OB Triage Note (Signed)
Lauren Romero is a 20 y.o. female. She is at [redacted]w[redacted]d gestation. Patient's last menstrual period was 09/22/2020 (exact date). Estimated Date of Delivery: 06/29/21  Prenatal care site[facility]  Chief complaint: contractions   HPI: Lauren Romero presents to L&D with complaints of uterine contractions that started around 0100 today.  She reports they were about every 4-6 minutes.  She tried resting and drinking water but they did not go away.  Denies LOF or vaginal bleeding.  Endorses good fetal movement.   Factors complicating pregnancy: History of preeclampsia  History of preterm birth d/t preeclampsia Limited prenatal care in 3rd trimester  History of IUGR  History of PPH requiring blood transfusion   S: Resting comfortably.no VB.no LOF,  Active fetal movement. Reports mild contractions.   Maternal Medical History:  Past Medical Hx:  has a past medical history of Anemia, Gestational hypertension, third trimester (04/21/2020), Poor fetal growth affecting management of mother in third trimester (04/21/2020), Preeclampsia, third trimester (04/12/2020), Pregnancy induced hypertension, and Pregnant (due date march 31/2020).    Past Surgical Hx: no significant surgical history    No Known Allergies   Prior to Admission medications   Not on File    Social History: She  reports that she has never smoked. She has never used smokeless tobacco. She reports that she does not currently use alcohol. She reports that she does not currently use drugs.  Family History: family history includes Diabetes in her maternal grandmother and paternal grandmother; Hypertension in her maternal grandmother and paternal grandmother.   Review of Systems: A full review of systems was performed and negative except as noted in the HPI.    O:  BP (!) 110/57   Pulse 78   Temp 98.6 F (37 C) (Oral)   LMP 09/22/2020 (Exact Date)  No results found for this or any previous visit (from the past 48 hour(s)).    Constitutional: NAD, AAOx3  Abd: gravid Psych: mood appropriate, speech normal Pelvic : deferred  Fetal Monitor: Baseline: 150 bpm Variability: moderate Accels: Present Decels: none Toco: every 4-6 minutes, mild to palpation, lasting 40-60 seconds  Category: I   Assessment: 20 y.o. [redacted]w[redacted]d here for antenatal surveillance during pregnancy.  Principle diagnosis: Preterm contractions  There were no encounter diagnoses.   Plan: IV fluid bolus x 1 liter  Wet prep, GC/CT, and urinalysis ordered  PO hydrate  Continuous fetal monitoring  Limited prenatal records available   ----- Margaretmary Eddy, CNM Certified Nurse Midwife [place]  [facility]

## 2021-05-12 NOTE — Consult Note (Signed)
Neonatology Consult  Note:  At the request of Redmond Pulling, D - CNM I met with Lauren Romero who is a 20 y.o. [id number] at [redacted]w[redacted]d admitted for Preterm labor.  Factors complicating pregnancy:  History of preeclampsia  History of preterm birth d/t preeclampsia Limited prenatal care in 3rd trimester  History of IUGR  History of PPH requiring blood transfusion   Current management includes BMZ $RemoveBefo'12mg'lDSiWQbyOQD$  today and in 24hrs, Procardia for contractions, if contractions persist after procardia, will administer terbutaline.  GBS culture obtained and pending - will give prophylactic Ampicillin for advanced dilation and continued uterine contractions.  We reviewed initial delivery room management, including CPAP, [place], and low but certainly possible need for intubation for surfactant administration.  We discussed feeding immaturity and need for full po intake with multiple days of good weight gain and no apnea or bradycardia before discharge.  We reviewed increased risk of jaundice, infection, and temperature instability.   Discussed likely length of stay.  Thank you for allowing [place] to participate in her care.  Please call with questions.  Higinio Roger, DO  Neonatologist  The total length of face-to-face or floor / unit time for this encounter was 20 minutes.  Counseling and / or coordination of care was greater than fifty percent of the time.

## 2021-05-12 NOTE — H&P (Addendum)
ANTEPARTUM ADMISSION HISTORY AND PHYSICAL NOTE   History of Present Illness: Lauren Romero is a 20 y.o. [id number] at [redacted]w[redacted]d admitted for Preterm labor. She presented to L&D with complaints of uterine contractions since 0100.     Fetal presentation is cephalic confirmed with [place]. Reports active fetal movement  Contractions: started at 1am, every 1 to 4 minutes with uterine irritability noted LOF/SROM: no, BBOW present Vaginal bleeding: reports vaginal spotting last night  Factors complicating pregnancy:  History of preeclampsia  History of preterm birth d/t preeclampsia Limited prenatal care in 3rd trimester  History of IUGR  History of PPH requiring blood transfusion   Patient Active Problem List   Diagnosis Date Noted   Preterm contractions 05/12/2021   Hx of preeclampsia, prior pregnancy, currently pregnant, third trimester 05/12/2021   Prior pregnancy complicated by IUGR, antepartum, third trimester 05/12/2021   H/O transfusion of packed red blood cells 05/09/2020   Insufficient prenatal care in third trimester 04/12/2020    Past Medical History:  Diagnosis Date   Anemia    Gestational hypertension, third trimester 04/21/2020   Poor fetal growth affecting management of mother in third trimester 04/21/2020   Preeclampsia, third trimester 04/12/2020   Pregnancy induced hypertension    Pregnant due date march 31/2020    History reviewed. No pertinent surgical history.  OB History  Gravida Para Term Preterm AB Living  3 2 1 1   2   SAB IAB Ectopic Multiple Live Births        0 2    # Outcome Date GA Lbr Len/2nd Weight Sex Delivery Anes PTL Lv  3 Current           2 Preterm 05/07/20 [redacted]w[redacted]d   F Vag-Spont EPI N LIV     Complications: Preeclampsia  1 Term 09/07/18 109w2d 12:34 / 00:12 2950 g M Vag-Vacuum EPI  LIV     Birth Comments: birthmakr right inner upper arm    Social History:  reports that she has never smoked. She has never used smokeless tobacco. She reports  that she does not currently use alcohol. She reports that she does not currently use drugs.  Family History: family history includes Diabetes in her maternal grandmother and paternal grandmother; Hypertension in her maternal grandmother and paternal grandmother.  No Known Allergies  No medications prior to admission.    ROS   Vitals:  BP (!) 112/58 (BP Location: Right Arm)   Pulse 70   Temp 98.2 F (36.8 C)   Resp 18   LMP 09/22/2020 (Exact Date)  Physical Examination:  General appearance: alert, cooperative and no distress Abdomen: gravid, soft, non-tender; bowel sounds normal; no masses,  no organomegaly Pelvic: cervix normal in appearance, external genitalia normal, no adnexal masses or tenderness, no bladder tenderness, no cervical motion tenderness, rectovaginal septum normal, urethra without abnormality   Extremities: extremities normal, atraumatic, no cyanosis or edema Membranes:intact  FHT:  FHR: 145 bpm, variability: moderate,  accelerations:  Present,  decelerations:  Present intermittent variable decelerations Category/reactivity:  Category II UC:   irregular, every 1-4 minutes SVE: 6/70/-2   Labs:  Results for orders placed or performed during the hospital encounter of 05/12/21 (from the past 24 hour(s))  Wet prep, genital   Collection Time: 05/12/21  7:01 AM   Specimen: Vaginal  Result Value Ref Range   Yeast Wet Prep HPF POC NONE SEEN NONE SEEN   Trich, Wet Prep NONE SEEN NONE SEEN   Clue Cells Wet Prep HPF POC  NONE SEEN NONE SEEN   WBC, Wet Prep HPF POC FEW (A) <10   Sperm NONE SEEN   Chlamydia/NGC rt PCR ([facility] only)   Collection Time: 05/12/21  7:01 AM   Specimen: Urine  Result Value Ref Range   Specimen source GC/Chlam URINE, RANDOM    Chlamydia Tr NOT DETECTED NOT DETECTED   N gonorrhoeae NOT DETECTED NOT DETECTED  Urinalysis, Complete w Microscopic Urine, Clean Catch   Collection Time: 05/12/21  7:01 AM  Result Value Ref Range   Color, Urine  AMBER (A) YELLOW   APPearance HAZY (A) CLEAR   Specific Gravity, Urine 1.028 1.005 - 1.030   pH 6.0 5.0 - 8.0   Glucose, UA NEGATIVE NEGATIVE mg/dL   Hgb urine dipstick NEGATIVE NEGATIVE   Bilirubin Urine NEGATIVE NEGATIVE   Ketones, ur 80 (A) NEGATIVE mg/dL   Protein, ur 30 (A) NEGATIVE mg/dL   Nitrite NEGATIVE NEGATIVE   Leukocytes,Ua LARGE (A) NEGATIVE   RBC / HPF 6-10 0 - 5 RBC/hpf   WBC, UA >50 (H) 0 - 5 WBC/hpf   Bacteria, UA FEW (A) NONE SEEN   Squamous Epithelial / LPF 6-10 0 - 5   Mucus PRESENT     Imaging Studies: Confirmed vertex with bedside US  Prenatal Labs: from [facility] Blood type/Rh B pos  Antibody screen neg  Rubella Immune  Varicella Immune  RPR NR  HBsAg Neg  HIV NR  GC neg  Chlamydia neg  Genetic screening   1 hour GTT Not in prenatal records  3 hour GTT Not in prenatal records  GBS Collected by PCR    Assessment and Plan:  A/P: 20 y.o. [id number] [redacted]w[redacted]d with uterine contractions  Called [facility] to fax over most recent prenatal records  Cervical dilation, r/o cervical insufficiency, PTL - BMZ 12mg  today and in 24hrs - Procardia for contractions, if contractions persist after procardia, will administer terbutaline. - GBS culture obtained and sent to lab, will give prophylactic Ampicillin for advanced dilation and continued uterine contractions - Wet prep negative - Gonorrhea/chlamydia negative - UA resulted     Gertie Fey, CNM Certified Nurse Midwife [facility]

## 2021-05-12 NOTE — Anesthesia Procedure Notes (Signed)
Epidural Patient location during procedure: OB Start time: 05/12/2021 2:00 PM End time: 05/12/2021 2:14 PM  Staffing Anesthesiologist: Reed Breech, MD Resident/CRNA: Jeanine Luz, CRNA Performed: resident/CRNA   Preanesthetic Checklist Completed: patient identified, IV checked, site marked, risks and benefits discussed, surgical consent, monitors and equipment checked, pre-op evaluation and timeout performed  Epidural Patient position: sitting Prep: ChloraPrep Patient monitoring: heart rate, continuous pulse ox and blood pressure Approach: midline Location: L3-L4 Injection technique: LOR saline  Needle:  Needle type: Tuohy  Needle gauge: 17 G Needle length: 9 cm and 9 Needle insertion depth: 5 cm Catheter type: closed end flexible Catheter size: 19 Gauge Catheter at skin depth: 10 cm Test dose: negative and 1.5% lidocaine with Epi 1:200 K  Assessment Sensory level: T10 Events: blood not aspirated, injection not painful, no injection resistance, no paresthesia and negative IV test  Additional Notes 1 attempt Pt. Evaluated and documentation done after procedure finished. Patient identified. Risks/Benefits/Options discussed with patient including but not limited to bleeding, infection, nerve damage, paralysis, failed block, incomplete pain control, headache, blood pressure changes, nausea, vomiting, reactions to medication both or allergic, itching and postpartum back pain. Confirmed with bedside nurse the patient's most recent platelet count. Confirmed with patient that they are not currently taking any anticoagulation, have any bleeding history or any family history of bleeding disorders. Patient expressed understanding and wished to proceed. All questions were answered. Sterile technique was used throughout the entire procedure. Please see nursing notes for vital signs. Test dose was given through epidural catheter and negative prior to continuing to dose epidural or start  infusion. Warning signs of high block given to the patient including shortness of breath, tingling/numbness in hands, complete motor block, or any concerning symptoms with instructions to call for help. Patient was given instructions on fall risk and not to get out of bed. All questions and concerns addressed with instructions to call with any issues or inadequate analgesia.    Patient tolerated the insertion well without immediate complications.Reason for block:procedure for pain

## 2021-05-12 NOTE — Anesthesia Preprocedure Evaluation (Signed)
Anesthesia Evaluation  Patient identified by MRN, date of birth, ID band Patient awake    Reviewed: Allergy & Precautions, H&P , NPO status , Patient's Chart, lab work & pertinent test results  Airway Mallampati: II  TM Distance: >3 FB Neck ROM: full    Dental no notable dental hx.    Pulmonary neg pulmonary ROS,    Pulmonary exam normal        Cardiovascular hypertension, Normal cardiovascular exam     Neuro/Psych negative neurological ROS  negative psych ROS   GI/Hepatic negative GI ROS, Neg liver ROS,   Endo/Other  negative endocrine ROS  Renal/GU negative Renal ROS  negative genitourinary   Musculoskeletal   Abdominal   Peds  Hematology  (+) Blood dyscrasia, anemia ,   Anesthesia Other Findings   Reproductive/Obstetrics (+) Pregnancy                             Anesthesia Physical Anesthesia Plan  ASA: 2  Anesthesia Plan: Epidural   Post-op Pain Management:    Induction:   PONV Risk Score and Plan:   Airway Management Planned:   Additional Equipment:   Intra-op Plan:   Post-operative Plan:   Informed Consent:   Plan Discussed with: Anesthesiologist and CRNA  Anesthesia Plan Comments:         Anesthesia Quick Evaluation

## 2021-05-12 NOTE — Progress Notes (Signed)
Labor Progress Note  Lauren Romero is a 20 y.o. [phone] at [redacted]w[redacted]d by LMP admitted for Preterm labor  Subjective: She reports contractions stopped for a while but have started again. Rates contraction pain 3/10.  Objective: BP (!) 112/58 (BP Location: Right Arm)   Pulse 70   Temp 98.2 F (36.8 C)   Resp 18   LMP 09/22/2020 (Exact Date)  Notable VS details: reviewed  Fetal Assessment: FHT:  FHR: 160 bpm, variability: moderate,  accelerations:  Present,  decelerations:  Absent Category/reactivity:  Category II UC:   regular, every 4-6 minutes SVE:    Dilation: 8cm  Effacement: 80%  Station:  -1  Consistency: soft  Position: posterior  Membrane status:intact  Labs: Lab Results  Component Value Date   WBC 11.0 (H) 05/12/2021   HGB 10.1 (L) 05/12/2021   HCT 30.2 (L) 05/12/2021   MCV 87.8 05/12/2021   PLT 252 05/12/2021    Assessment / Plan: Spontaneous preterm labor, progressing despite terbutaline and procardia  Labor: Progressing normally Preeclampsia:  no signs or symptoms of toxicity Fetal Wellbeing:  Category II Pain Control:  Epidural I/D:   GBS negative by PCR Anticipated MOD:  NSVD  Janyce Llanos, CNM 05/12/2021, 1:46 PM

## 2021-05-12 NOTE — OB Triage Note (Signed)
Pt is a G3P2 [redacted]w[redacted]d presenting to L&D for  contractions. Pt states contractions started at 0100 and  have increased in frequency over the last several hours. Pt reports positive fetal movement and denies LOF and vaginal bleeding. Pt reports stringy mucous discharge, denies foul smell. Denies sexual intercourse in the last 24 hours. Monitors applied and assessing. VSS. Triage report given to Margaretmary Eddy, CNM.

## 2021-05-12 NOTE — Discharge Summary (Signed)
Obstetrical Discharge Summary  Patient Name: Lauren Romero DOB: [birth year] MRN: [id number]  Date of Admission: 05/12/2021 Date of Delivery: 05/12/2021 Delivered by: Donato Schultz, CNM Date of Discharge: 05/13/2021  Primary [facility]: [facility]  [id number] last menstrual period was 09/22/2020 (exact date). EDC Estimated Date of Delivery: 06/29/21 Gestational Age at Delivery: [redacted]w[redacted]d   Antepartum complications:  History of preeclampsia  History of preterm birth d/t preeclampsia Limited prenatal care in 3rd trimester  History of IUGR  History of PPH requiring blood transfusion   Admitting Diagnosis: preterm contractions Secondary Diagnosis: preterm NSVD Patient Active Problem List   Diagnosis Date Noted   Preterm contractions 05/12/2021   Hx of preeclampsia, prior pregnancy, currently pregnant, third trimester 05/12/2021   Prior pregnancy complicated by IUGR, antepartum, third trimester 05/12/2021   Preterm labor 05/12/2021   H/O transfusion of packed red blood cells 05/09/2020   Insufficient prenatal care in third trimester 04/12/2020    Augmentation: AROM Complications: None Intrapartum complications/course: She came in with preterm contractions. Contractions did not stop with IVF, terbutaline, or procardia. She progressed from 6-8cm with BBOW, AROM with clear fluid at 1648, 10/100/+2 at 1741, NSVD at 1748. 1st degree laceration, approximated and not bleeding. Bleeding scant.  Date of Delivery: 05/12/21 Delivered By: Donato Schultz, CNM Delivery Type: spontaneous vaginal delivery Anesthesia: epidural Placenta: spontaneous Laceration: 1st degree, no need for repair Episiotomy: none Newborn Data: Live born female  Birth Weight: 4 lb 0.2 oz (1820 g) APGAR: 8, 8  Newborn Delivery   Birth date/time: 05/12/2021 17:48:00 Delivery type: Vaginal, Spontaneous      Postpartum Procedures: none  Edinburgh:  Edinburgh Postnatal Depression Scale Screening Tool  05/13/2021 09/08/2018  I have been able to laugh and see the funny side of things. 0 0  I have looked forward with enjoyment to things. 0 0  I have blamed myself unnecessarily when things went wrong. 0 0  I have been anxious or worried for no good reason. 0 0  I have felt scared or panicky for no good reason. 0 0  Things have been getting on top of me. 0 0  I have been so unhappy that I have had difficulty sleeping. 0 0  I have felt sad or miserable. 0 0  I have been so unhappy that I have been crying. 1 0  The thought of harming myself has occurred to me. 1 0  Edinburgh Postnatal Depression Scale Total 2 0      Post partum course:  Patient had an uncomplicated postpartum course.  By time of discharge on PPD#1, her pain was controlled on oral pain medications; she had appropriate lochia and was ambulating, voiding without difficulty and tolerating regular diet.  She was deemed stable for discharge to home.    Discharge Physical Exam:  BP 115/78 (BP Location: Left Arm)   Pulse 75   Temp 98.8 F (37.1 C) (Oral)   Resp 20   Ht 5' 4.02" (1.626 m)   Wt 52.6 kg   LMP 09/22/2020 (Exact Date)   SpO2 100%   Breastfeeding Unknown   BMI 19.90 kg/m   General: NAD CV: RRR Pulm: CTABL, nl effort ABD: s/nd/nt, fundus firm and below the umbilicus Lochia: small Perineum: well approximated  DVT Evaluation: LE non-ttp, no evidence of DVT on exam.  Hemoglobin  Date Value Ref Range Status  05/13/2021 9.9 (L) 12.0 - 15.0 g/dL Final   HCT  Date Value Ref Range Status  05/13/2021 29.2 (L) 36.0 - 46.0 %  Final   Elevated WBCs noted: 21, afebrile, likely r/t labor, initial 24hrs PP.   Disposition: stable, discharge to home. Baby Feeding: formula Baby Disposition: SCN d/t prematurity  Rh Immune globulin given:  Rubella vaccine given: immune Varicella vaccine given: immune Tdap vaccine given in AP or PP setting: declined Flu vaccine given in AP or PP setting: declined  Contraception:  considering Nexplanon after discharge.  Prenatal Labs:  Blood type/Rh B pos  Antibody screen neg  Rubella Immune  Varicella Immune  RPR NR  HBsAg Neg  HIV NR  GC neg  Chlamydia neg  Genetic screening    1 hour GTT Not performed  3 hour GTT Not performed  GBS Negative     Plan:  Shivani Barrantes was discharged to home in good condition. Follow-up appointment with delivering provider in 1-2 weeks. Has appt on 05/26/21 at [facility] per pt.   Discharge Medications: Allergies as of 05/13/2021   No Known Allergies      Medication List     TAKE these medications    acetaminophen 325 MG tablet Commonly known as: Tylenol Take 2 tablets (650 mg total) by mouth every 4 (four) hours as needed (for pain scale < 4).   benzocaine-Menthol 20-0.5 % Aero Commonly known as: DERMOPLAST Apply 1 application topically as needed for irritation (perineal discomfort).   diphenhydrAMINE 25 mg capsule Commonly known as: BENADRYL Take 1 capsule (25 mg total) by mouth every 6 (six) hours as needed for itching.   ibuprofen 600 MG tablet Commonly known as: ADVIL Take 1 tablet (600 mg total) by mouth every 6 (six) hours.   senna-docusate 8.6-50 MG tablet Commonly known as: Senokot-S Take 2 tablets by mouth daily. Start taking on: May 14, 2021   simethicone 80 MG chewable tablet Commonly known as: MYLICON Chew 1 tablet (80 mg total) by mouth as needed for flatulence.   witch hazel-glycerin pad Commonly known as: TUCKS Apply 1 application topically as needed for hemorrhoids.         Follow-up Information     Janyce Llanos, CNM Follow up in 6 week(s).   Specialty: Certified Nurse Midwife Why: 6wk postpartum with delivering midwife, or may follow-up with Kara Dies information: [address] [place] [address] [phone]         [facility] Follow up in 1 week(s).   Specialty: General Practice Why: PP followup  for mood check Contact information: [address] [place] [address] [phone]                 Signed:  Randa Ngo, CNM 05/13/2021  10:05 AM

## 2021-05-13 LAB — CBC
HCT: 29.2 % — ABNORMAL LOW (ref 36.0–46.0)
Hemoglobin: 9.9 g/dL — ABNORMAL LOW (ref 12.0–15.0)
MCH: 29.6 pg (ref 26.0–34.0)
MCHC: 33.9 g/dL (ref 30.0–36.0)
MCV: 87.2 fL (ref 80.0–100.0)
Platelets: 266 10*3/uL (ref 150–400)
RBC: 3.35 MIL/uL — ABNORMAL LOW (ref 3.87–5.11)
RDW: 12.9 % (ref 11.5–15.5)
WBC: 21 10*3/uL — ABNORMAL HIGH (ref 4.0–10.5)
nRBC: 0 % (ref 0.0–0.2)

## 2021-05-13 LAB — RPR: RPR Ser Ql: NONREACTIVE

## 2021-05-13 MED ORDER — IBUPROFEN 600 MG PO TABS: 600.0000 mg | ORAL_TABLET | Freq: Four times a day (QID) | ORAL | 0 refills | Status: AC

## 2021-05-13 MED ORDER — DIPHENHYDRAMINE HCL 25 MG PO CAPS: 25.0000 mg | ORAL_CAPSULE | Freq: Four times a day (QID) | ORAL | 0 refills | Status: AC | PRN

## 2021-05-13 MED ORDER — ACETAMINOPHEN 325 MG PO TABS: 650.0000 mg | ORAL_TABLET | ORAL | 0 refills | Status: AC | PRN

## 2021-05-13 MED ORDER — WITCH HAZEL-GLYCERIN EX PADS: 1.0000 "application " | MEDICATED_PAD | CUTANEOUS | Status: AC | PRN

## 2021-05-13 MED ORDER — SENNOSIDES-DOCUSATE SODIUM 8.6-50 MG PO TABS: 2.0000 | ORAL_TABLET | Freq: Every day | ORAL | 0 refills | Status: AC

## 2021-05-13 MED ORDER — SIMETHICONE 80 MG PO CHEW: 80.0000 mg | CHEWABLE_TABLET | ORAL | 0 refills | Status: AC | PRN

## 2021-05-13 MED ORDER — BENZOCAINE-MENTHOL 20-0.5 % EX AERO: 1.0000 "application " | INHALATION_SPRAY | CUTANEOUS | Status: AC | PRN

## 2021-05-13 NOTE — Lactation Note (Signed)
This note was copied from a baby's chart. Lactation Consultation Note  Patient Name: Lauren Romero DGLOV'F Date: 05/13/2021 Reason for consult: Initial assessment;NICU baby;Preterm <34wks;Infant < 6lbs Age:22 hours  Lactation spoke with mom at bedside in SCN. This is her third baby, second one born premature at 68 weeks. LC asked about mom's feeding preferences and plan. Mom states that she wishes to only provide her expressed colostrum if able to obtain anything, with no plans to continue breast stimulation/expression post discharge from the hospital. Mom also mentioned for baby to receive donor breastmilk if possible; follow-up with RN and MD needed. Praised mom for wanting to provide her colostrum, encouraged her to reach out if she has questions or needs additional support.  Maternal Data    Feeding Mother's Current Feeding Choice: Breast Milk and Formula  LATCH Score                    Lactation Tools Discussed/Used Tools: Pump Breast pump type: Double-Electric Breast Pump Reason for Pumping: NICU Pumping frequency:  (occasionally; wants to give colostrum only)  Interventions Interventions: DEBP;Education  Discharge    Consult Status Consult Status: Complete    Danford Bad 05/13/2021, 9:02 AM

## 2021-05-13 NOTE — TOC Initial Note (Signed)
Transition of Care [facility]) - Initial/Assessment Note    Patient Details  Name: Lauren Romero MRN: [id number] Date of Birth: January 13, 2001  Transition of Care [facility]) CM/SW Contact:    Lebam Cellar, RN Phone Number: 05/13/2021, 10:31 AM  Clinical Narrative:                 Spoke to patient and discussed Edinburgh score as well as drug use. Patient reports she has a 22 year old and a 22 year old at home and FOB is not involved. Waiting on paternity test. Reports previous CPS cases with other children and understands CPS will be contacted for this child due to drug use. MOB requested assistance with car seat and pack and play if possible. Plans to bottlefeed and is active with [facility] services. Works at [employer] and is happy to have just found out she was approved for daycare vouchers starting December 1. Mother reported she does smoke Marijuana but always waits for the kids to be asleep. Mother reported she has transportation home and is eager to get home to her family. Infant will remain in SCN. Reports history of depression/anxiety although never officially diagnosed and treated. Discussed PPD and symptoms/resources.         Patient Goals and CMS Choice        Expected Discharge Plan and Services           Expected Discharge Date: 05/13/21                                    Prior Living Arrangements/Services                       Activities of Daily Living Home Assistive Devices/Equipment: None ADL Screening (condition at time of admission) Patient's cognitive ability adequate to safely complete daily activities?: Yes Is the patient deaf or have difficulty hearing?: No Does the patient have difficulty seeing, even when wearing glasses/contacts?: No Does the patient have difficulty concentrating, remembering, or making decisions?: No Patient able to express need for assistance with ADLs?: Yes Does the patient have difficulty dressing or bathing?:  No Independently performs ADLs?: Yes (appropriate for developmental age) Does the patient have difficulty walking or climbing stairs?: No Weakness of Legs: None Weakness of Arms/Hands: None  Permission Sought/Granted                  Emotional Assessment              Admission diagnosis:  Preterm contractions [O47.00] Preterm labor [O60.00] Patient Active Problem List   Diagnosis Date Noted   Preterm contractions 05/12/2021   Hx of preeclampsia, prior pregnancy, currently pregnant, third trimester 05/12/2021   Prior pregnancy complicated by IUGR, antepartum, third trimester 05/12/2021   Preterm labor 05/12/2021   H/O transfusion of packed red blood cells 05/09/2020   Insufficient prenatal care in third trimester 04/12/2020   PCP:  [facility] Pharmacy:   [id number] [place], [place] - [address] AT [facility] OF [address] [place] [address] [place] [address] Phone: [phone] Fax: [phone]     Social Determinants of Health (SDOH) Interventions    Readmission Risk Interventions No flowsheet data found.

## 2021-05-13 NOTE — Progress Notes (Addendum)
Patient discharged home. Infant still a patient in SCN. Discharge instructions and prescriptions given and reviewed with patient. Patient verbalized understanding.   Follow-up appointment will be at [facility]. Patient states she has scheduled for herself on 12/7. Requests to get Nexplanon at that appointment.   Plans to suppress milk supply. Will give phone number and instructions for visiting infant in SCN.   Transitions of care spoke with patient and cleared for discharge home.   Escorted out by staff.

## 2021-05-13 NOTE — Discharge Instructions (Addendum)
Discharge Instructions:   Follow-up Appointment: Call and schedule a follow-up appointment for a visit at [facility] in 1-week and 6-weeks!  If there are any new medications, they have been ordered and will be available for pickup at the listed pharmacy on your way home from the hospital.   Call office if you have any of the following: headache, visual changes, fever >101.0 F, chills, shortness of breath, breast concerns, excessive vaginal bleeding, incision drainage or problems, leg pain or redness, depression or any other concerns. If you have vaginal discharge with an odor, let your doctor know.   It is normal to bleed for up to 6 weeks. You should not soak through more than 1 pad in 1 hour. If you have a blood clot larger than your fist with continued bleeding, call your doctor.   Activity: Do not lift > 10 lbs for 6 weeks (do not lift anything heavier than your baby). No intercourse, tampons, swimming pools, hot tubs, baths (only showers) for 6 weeks.  No driving for 1-2 weeks. Continue prenatal vitamin, especially if breastfeeding. Increase calories and fluids (water) while breastfeeding.   Your milk will come in, in the next couple of days (right now it is colostrum). You may have a slight fever when your milk comes in, but it should go away on its own.  If it does not, and rises above 101 F please call the doctor. You will also feel achy and your breasts will be firm. They will also start to leak. If you are breastfeeding, continue as you have been and you can pump/express milk for comfort.   If you have too much milk, your breasts can become engorged, which could lead to mastitis. This is an infection of the milk ducts. It can be very painful and you will need to notify your doctor to obtain a prescription for antibiotics. You can also treat it with a shower or hot/cold compress.   For concerns about your baby, please call your pediatrician.  For breastfeeding  concerns, the lactation consultant can be reached at [phone].   Postpartum blues (feelings of happy one minute and sad another minute) are normal for the first few weeks but if it gets worse let your doctor know.   Congratulations! We enjoyed caring for you and your new bundle of joy!

## 2021-05-13 NOTE — Anesthesia Postprocedure Evaluation (Signed)
Anesthesia Post Note  Patient: Lauren Romero  Procedure(s) Performed: AN AD HOC LABOR EPIDURAL  Patient location during evaluation: Mother Baby Anesthesia Type: Epidural Level of consciousness: awake and alert Pain management: pain level controlled Vital Signs Assessment: post-procedure vital signs reviewed and stable Respiratory status: spontaneous breathing, nonlabored ventilation and respiratory function stable Cardiovascular status: stable Postop Assessment: no headache, no backache, patient able to bend at knees and able to ambulate Anesthetic complications: no   No notable events documented.   Last Vitals:  Vitals:   05/13/21 0351 05/13/21 0759  BP: 126/77 115/78  Pulse: 65 75  Resp: 17 20  Temp: 36.8 C 37.1 C  SpO2: 100% 100%    Last Pain:  Vitals:   05/13/21 0759  TempSrc: Oral  PainSc:                  Cleda Mccreedy Altamese Deguire

## 2021-05-17 LAB — SURGICAL PATHOLOGY

## 2021-08-27 IMAGING — US US OB COMP LESS 14 WK
1 series · 14 of 28 positions shown · non-contrast
Comparison: None.

CLINICAL DATA: Ten weeks pregnant with vaginal bleeding.

EXAM:
OBSTETRIC <14 WK US AND TRANSVAGINAL OB US
TECHNIQUE: Both transabdominal and transvaginal ultrasound examinations were
performed for complete evaluation of the gestation as well as the
maternal uterus, adnexal regions, and pelvic cul-de-sac.
Transvaginal technique was performed to assess early pregnancy.

[Series 1: us ob comp less 14 wks · 14 of 90 slices shown]
[im 4/90]
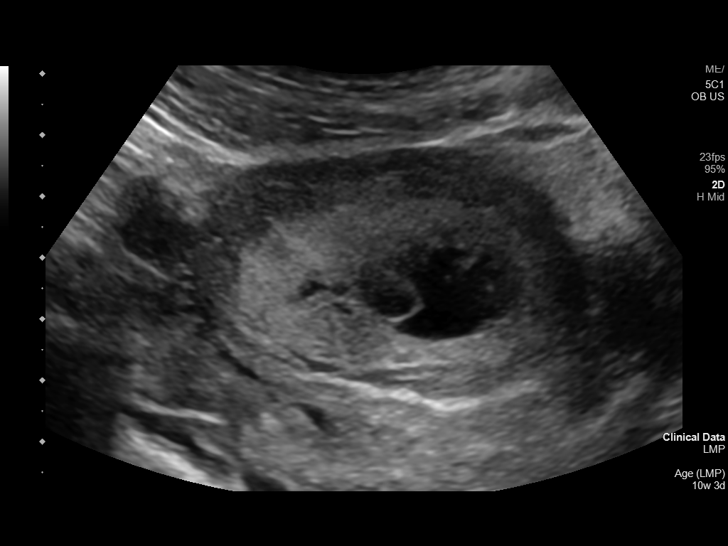
[im 10/90]
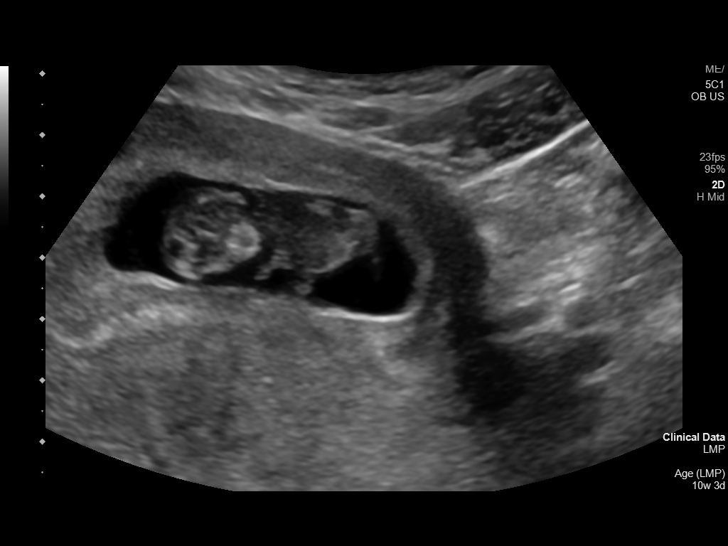
[im 17/90]
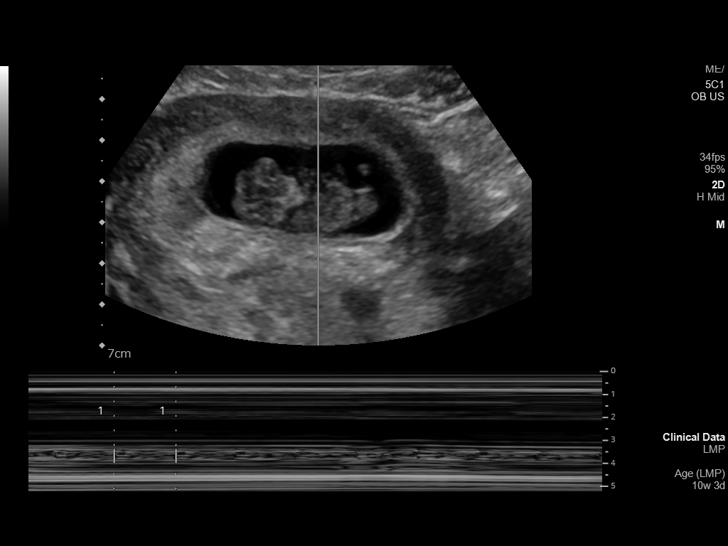
[im 24/90]
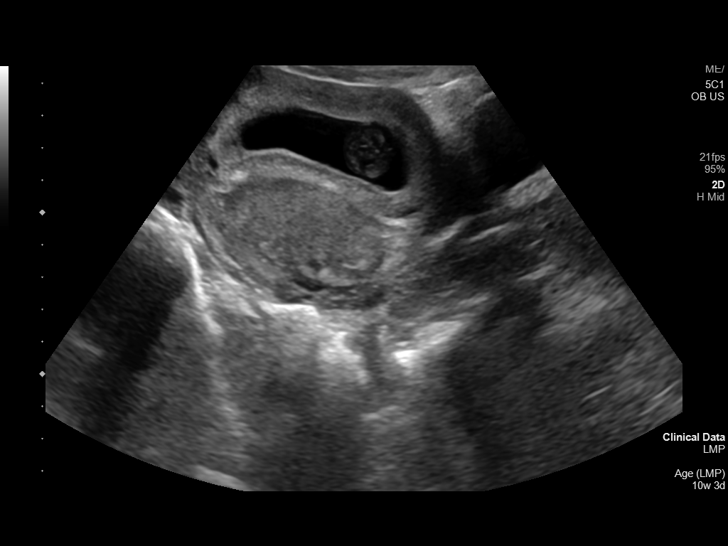
[im 30/90]
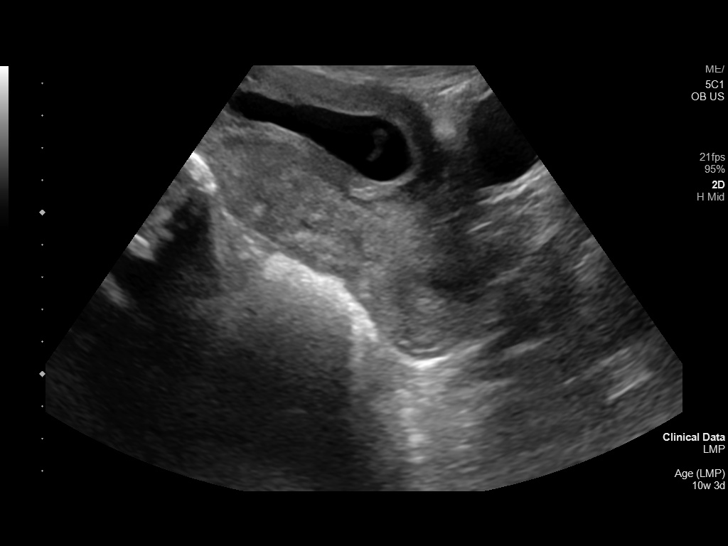
[im 37/90]
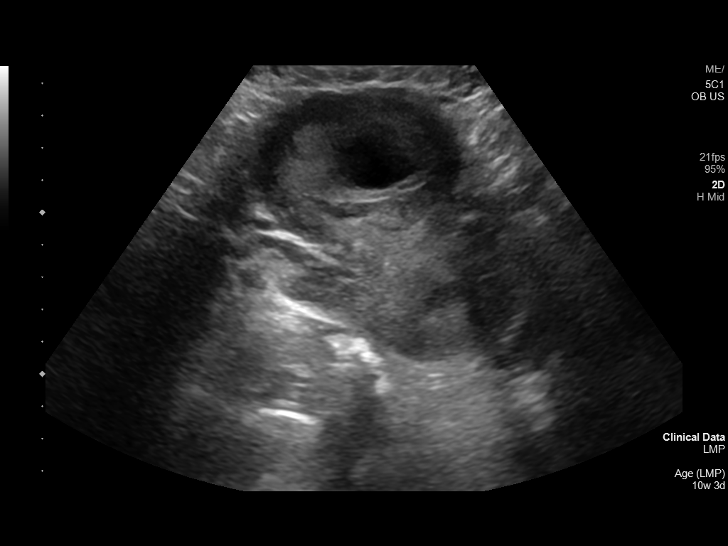
[im 43/90]
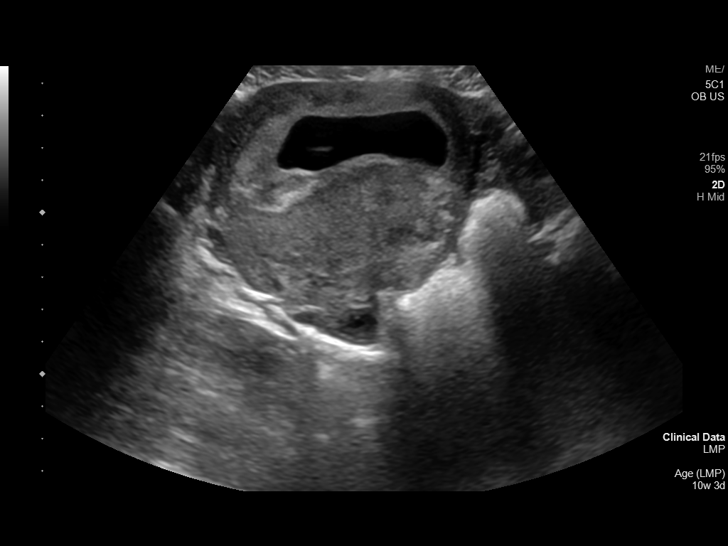
[im 50/90]
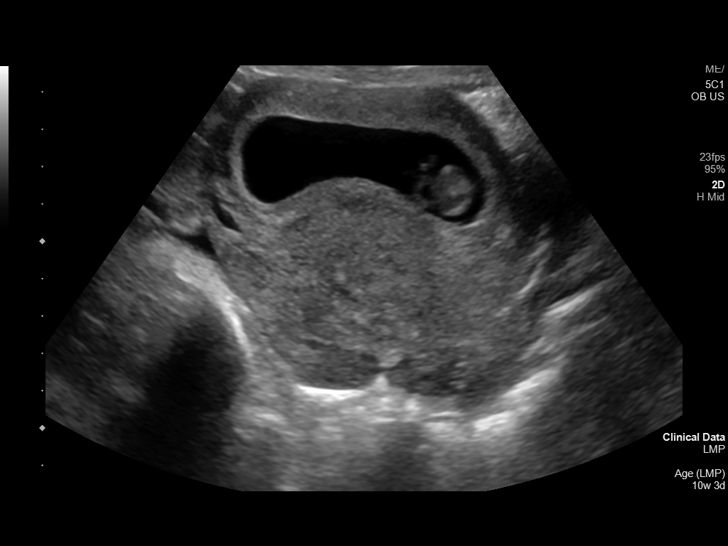
[im 57/90]
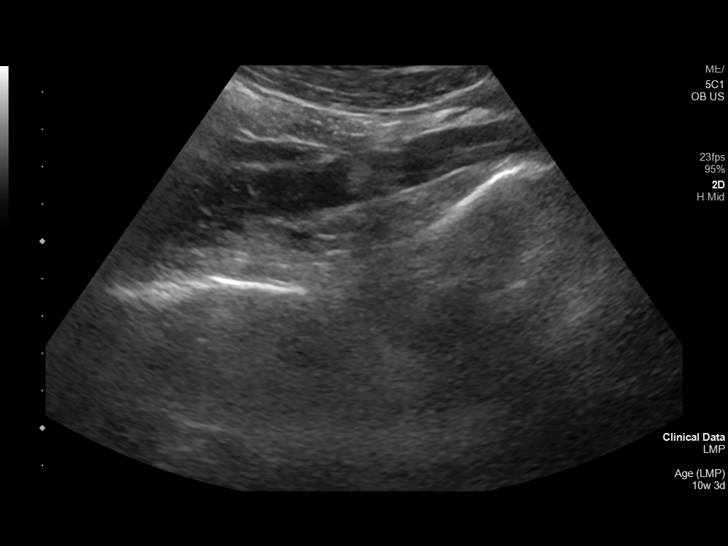
[im 63/90]
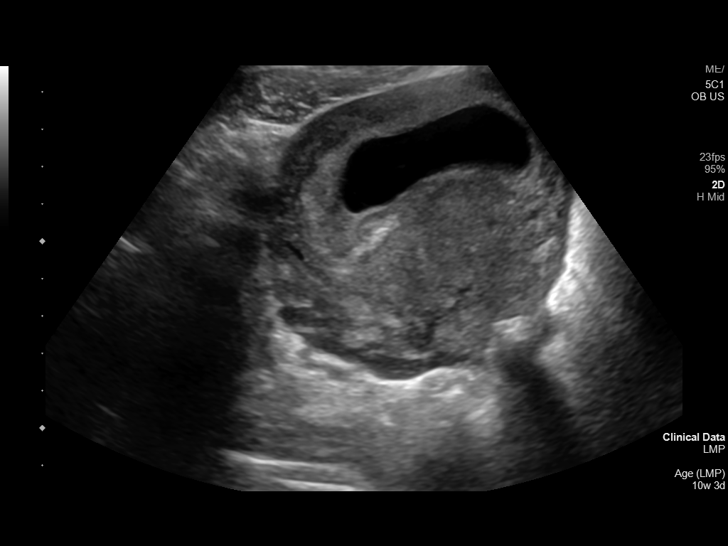
[im 70/90]
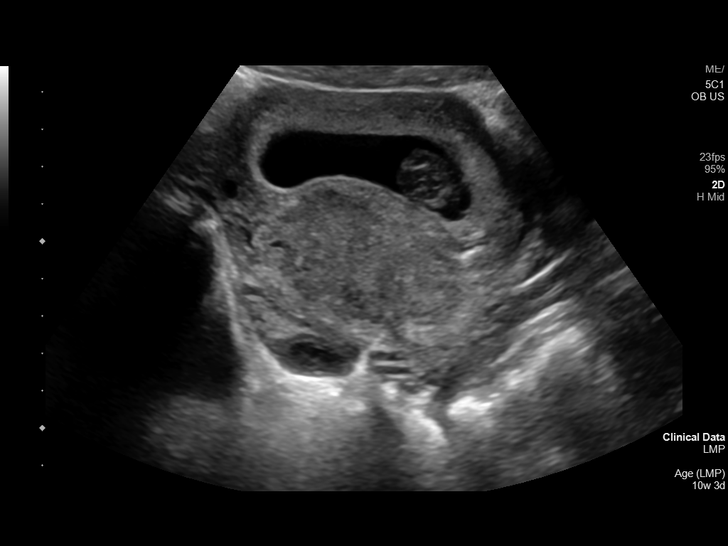
[im 76/90]
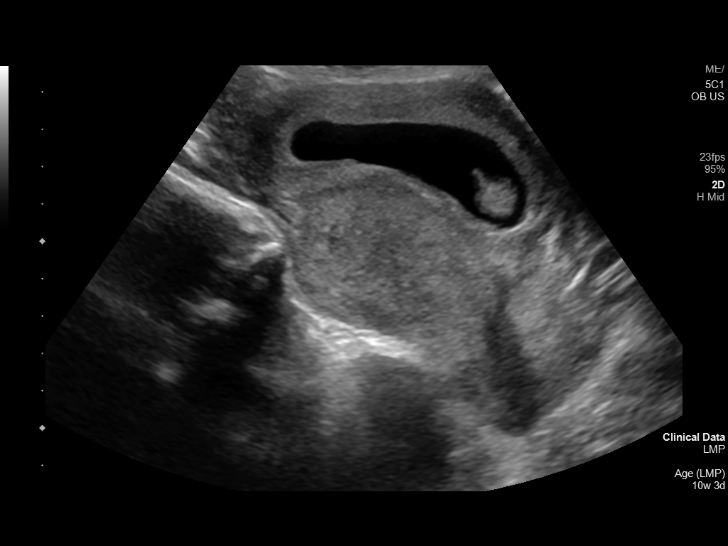
[im 83/90]
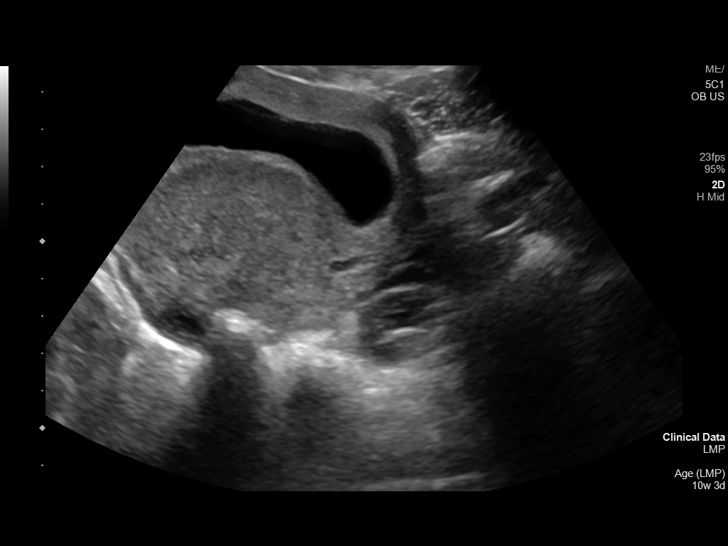
[im 90/90]
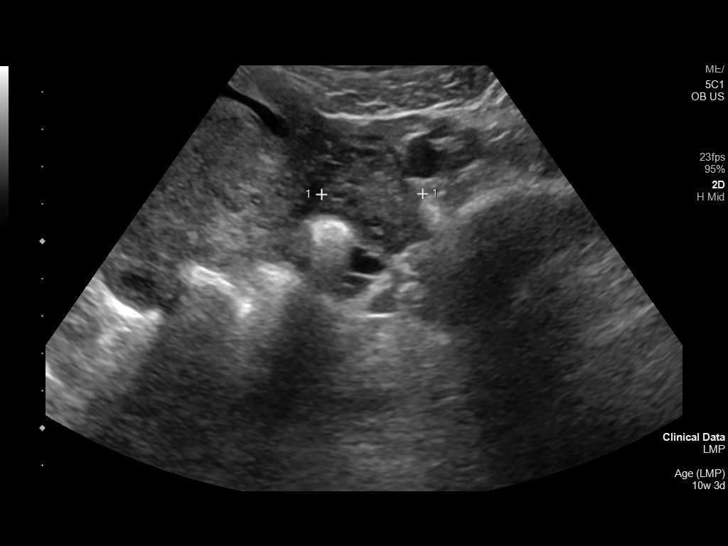

[14 of 28 positions shown; findings below may reference images not displayed]

FINDINGS: Intrauterine gestational sac: Present

Yolk sac:  Present

Embryo:  Present

Cardiac Activity: Present

Heart Rate: 166 bpm

CRL:  36 mm   10 w   3 d                  US EDC: 06/29/2021

Subchorionic hemorrhage:  None visualized.

Maternal uterus/adnexae: Both ovaries are normal. Corpus luteum cyst
noted on the right. No free pelvic fluid.
IMPRESSION: 1. Single living intrauterine fetus estimated at 10 weeks and 3 days
gestation.
2. No subchorionic hemorrhage identified.
3. Normal ovaries.

## 2022-11-13 ENCOUNTER — Emergency Department (HOSPITAL_COMMUNITY): Payer: Medicaid Other

## 2022-11-13 ENCOUNTER — Encounter (HOSPITAL_COMMUNITY): Payer: Self-pay

## 2022-11-13 ENCOUNTER — Other Ambulatory Visit: Payer: Self-pay

## 2022-11-13 ENCOUNTER — Emergency Department (HOSPITAL_COMMUNITY)
Admission: EM | Admit: 2022-11-13 | Discharge: 2022-11-13 | Disposition: A | Discharge status: Home / Self Care | Payer: Medicaid Other | Attending: Emergency Medicine | Admitting: Emergency Medicine

## 2022-11-13 DIAGNOSIS — S60943A Unspecified superficial injury of left middle finger, initial encounter: Secondary | ICD-10-CM | POA: Diagnosis present

## 2022-11-13 DIAGNOSIS — S62653A Nondisplaced fracture of medial phalanx of left middle finger, initial encounter for closed fracture: Secondary | ICD-10-CM | POA: Diagnosis not present

## 2022-11-13 MED ORDER — NAPROXEN 250 MG PO TABS
500.0000 mg | ORAL_TABLET | Freq: Once | ORAL | Status: AC
  Administered 2022-11-13: 500 mg via ORAL
  Filled 2022-11-13: qty 2

## 2022-11-13 MED ORDER — NAPROXEN 500 MG PO TABS
500.0000 mg | ORAL_TABLET | Freq: Two times a day (BID) | ORAL | 0 refills | Status: AC
Start: 2022-11-13 — End: ?
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
## 2022-11-13 NOTE — Discharge Instructions (Addendum)
You do have a fracture of your finger, this will need to be seen by a hand specialist or an orthopedic surgeon.  Please call on Tuesday morning after the holiday to make an appointment with an orthopedist or hand specialist in your area or see Dr. Janee Morn who I have given you the phone number for above.  Please take Naprosyn, 500mg  by mouth twice daily as needed for pain - this in an antiinflammatory medicine (NSAID) and is similar to ibuprofen - many people feel that it is stronger than ibuprofen and it is easier to take since it is a smaller pill.  Please use this only for 1 week - if your pain persists, you will need to follow up with your doctor in the office for ongoing guidance and pain control.  Keep the splint on your finger until you follow-up  Emergency department for worsening symptoms

## 2022-11-13 NOTE — Progress Notes (Signed)
Orthopedic Tech Progress Note Patient Details:  Lauren Romero 02/05/2001 [id number]  Ortho Devices Type of Ortho Device: Finger splint Ortho Device/Splint Location: lue middle finger splint. Ortho Device/Splint Interventions: Ordered, Application, Adjustment   Post Interventions Patient Tolerated: Well Instructions Provided: Care of device, Adjustment of device  Trinna Post 11/13/2022, 8:59 PM
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
## 2022-12-01 ENCOUNTER — Ambulatory Visit (INDEPENDENT_AMBULATORY_CARE_PROVIDER_SITE_OTHER): Payer: Medicaid Other

## 2022-12-01 ENCOUNTER — Ambulatory Visit
Admission: EM | Admit: 2022-12-01 | Discharge: 2022-12-01 | Disposition: A | Discharge status: Home / Self Care | Payer: Medicaid Other | Attending: Urgent Care | Admitting: Urgent Care

## 2022-12-01 DIAGNOSIS — M79645 Pain in left finger(s): Secondary | ICD-10-CM | POA: Diagnosis not present

## 2022-12-01 DIAGNOSIS — S62623A Displaced fracture of medial phalanx of left middle finger, initial encounter for closed fracture: Secondary | ICD-10-CM

## 2022-12-01 MED ORDER — IBUPROFEN 400 MG PO TABS: 400.0000 mg | ORAL_TABLET | Freq: Four times a day (QID) | ORAL | 0 refills | Status: AC | PRN

## 2022-12-01 NOTE — Discharge Instructions (Addendum)
Follow-up with Dr. Yehuda Budd at [facility] as soon as possible.  Call their clinic today to make an appointment for follow-up. Wear the splint at all times. Do not get your splint wet. Use ibuprofen for pain and inflammation.
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
## 2022-12-01 NOTE — ED Provider Notes (Signed)
[facility] - URGENT CARE CENTER  Note:  This document was prepared using Conservation officer, historic buildings and may include unintentional dictation errors.  MRN: [id number] DOB: [birth year]  Subjective:   Lauren Romero is a 22 y.o. female presenting for 3-week history of acute onset of persistent left middle finger pain and swelling.  Patient was found to have an oblique displaced fracture of the third middle phalanx with extension into the DIP joint.  She was advised to follow-up with an orthopedist and she did set up an appointment but is not until July.  Has been wearing her metallic static splint very consistently.  She seldomly removes it.  Was hoping to have x-rays again.  No repeat injury.  No current facility-administered medications for this encounter.  Current Outpatient Medications:    acetaminophen (TYLENOL) 325 MG tablet, Take 2 tablets (650 mg total) by mouth every 4 (four) hours as needed (for pain scale < 4)., Disp: 30 tablet, Rfl: 0   benzocaine-Menthol (DERMOPLAST) 20-0.5 % AERO, Apply 1 application topically as needed for irritation (perineal discomfort)., Disp: , Rfl:    diphenhydrAMINE (BENADRYL) 25 mg capsule, Take 1 capsule (25 mg total) by mouth every 6 (six) hours as needed for itching., Disp: , Rfl: 0   ibuprofen (ADVIL) 600 MG tablet, Take 1 tablet (600 mg total) by mouth every 6 (six) hours., Disp: 30 tablet, Rfl: 0   naproxen (NAPROSYN) 500 MG tablet, Take 1 tablet (500 mg total) by mouth 2 (two) times daily with a meal., Disp: 30 tablet, Rfl: 0   senna-docusate (SENOKOT-S) 8.6-50 MG tablet, Take 2 tablets by mouth daily., Disp: 60 tablet, Rfl: 0   simethicone (MYLICON) 80 MG chewable tablet, Chew 1 tablet (80 mg total) by mouth as needed for flatulence., Disp: , Rfl: 0   witch hazel-glycerin (TUCKS) pad, Apply 1 application topically as needed for hemorrhoids., Disp: , Rfl:    No Known Allergies  Past Medical History:  Diagnosis Date   Anemia     Gestational hypertension, third trimester 04/21/2020   Poor fetal growth affecting management of mother in third trimester 04/21/2020   Preeclampsia, third trimester 04/12/2020   Pregnancy induced hypertension    Pregnant due date march 31/2020     History reviewed. No pertinent surgical history.  Family History  Problem Relation Age of Onset   Diabetes Maternal Grandmother    Hypertension Maternal Grandmother    Diabetes Paternal Grandmother    Hypertension Paternal Grandmother     Social History   Tobacco Use   Smoking status: Never   Smokeless tobacco: Never  Vaping Use   Vaping Use: Never used  Substance Use Topics   Alcohol use: Not Currently   Drug use: Not Currently    ROS   Objective:   Vitals: BP 106/62 (BP Location: Right Arm)   Pulse 91   Temp 98.6 F (37 C) (Oral)   Resp 16   SpO2 98%   Breastfeeding No   Physical Exam Constitutional:      General: She is not in acute distress.    Appearance: Normal appearance. She is well-developed. She is not ill-appearing, toxic-appearing or diaphoretic.  HENT:     Head: Normocephalic and atraumatic.     Nose: Nose normal.     Mouth/Throat:     Mouth: Mucous membranes are moist.  Eyes:     General: No scleral icterus.       Right eye: No discharge.  Left eye: No discharge.     Extraocular Movements: Extraocular movements intact.  Cardiovascular:     Rate and Rhythm: Normal rate.  Pulmonary:     Effort: Pulmonary effort is normal.  Musculoskeletal:       Hands:  Skin:    General: Skin is warm and dry.  Neurological:     General: No focal deficit present.     Mental Status: She is alert and oriented to person, place, and time.  Psychiatric:        Mood and Affect: Mood normal.        Behavior: Behavior normal.     DG Finger Middle Left  Result Date: 12/01/2022 CLINICAL DATA:  Middle finger fracture. EXAM: LEFT MIDDLE FINGER 2+V COMPARISON:  Middle finger radiographs 11/13/2022. FINDINGS:  The oblique fracture through the diaphysis of the middle finger middle phalanx extending into the DIP joint is unchanged in alignment, with minimal radial displacement of the distal fragment. The fracture lucency remains visible with no appreciable interval reparative change. There is no new fracture. There is no erosive change. IMPRESSION: Unchanged alignment of the oblique fracture of the middle finger middle phalanx diaphysis extending into the DIP joint. Electronically Signed   By: Lesia Hausen M.D.   On: 12/01/2022 15:39     Assessment and Plan :   PDMP not reviewed this encounter.  1. Displaced fracture of middle phalanx of left middle finger, initial encounter for closed fracture   2. Finger pain, left      Patient has a toddler and has to be very active with her hands. I do not believe that the fracture itself is unstable but there is a high risk for more harm without more immobilization. I discussed this with patient and she was in agreement.  As such, I placed her into a short arm gutter splint involving the third and fourth left fingers with the wrist in slight extension.  Follow-up with [facility] as soon as possible.  Use ibuprofen for pain and inflammation.  Counseled patient on potential for adverse effects with medications prescribed/recommended today, ER and return-to-clinic precautions discussed, patient verbalized understanding.    Wallis Bamberg, [place] 12/01/22 1552

## 2024-05-07 ENCOUNTER — Ambulatory Visit (LOCAL_COMMUNITY_HEALTH_CENTER)

## 2024-05-07 VITALS — BP 129/58 | Ht 64.5 in | Wt 105.5 lb

## 2024-05-07 DIAGNOSIS — Z3201 Encounter for pregnancy test, result positive: Secondary | ICD-10-CM

## 2024-05-07 LAB — PREGNANCY, URINE: Preg Test, Ur: POSITIVE — AB

## 2024-05-07 NOTE — Progress Notes (Signed)
 UPT positive . Plans on prenatal care at [facility]. Pt declined prenatal vitamins. Stated she was planning on buying some from pharmacy. Sent to clerical for presumptive eligibility.

## 2024-05-23 ENCOUNTER — Emergency Department
Admission: EM | Admit: 2024-05-23 | Discharge: 2024-05-23 | Disposition: A | Discharge status: Home / Self Care | Attending: Emergency Medicine | Admitting: Emergency Medicine

## 2024-05-23 ENCOUNTER — Other Ambulatory Visit: Payer: Self-pay

## 2024-05-23 ENCOUNTER — Emergency Department (HOSPITAL_COMMUNITY)
Admission: EM | Admit: 2024-05-23 | Discharge: 2024-05-23 | Discharge status: Against Medical Advice | Attending: Emergency Medicine | Admitting: Emergency Medicine

## 2024-05-23 DIAGNOSIS — O219 Vomiting of pregnancy, unspecified: Secondary | ICD-10-CM | POA: Diagnosis present

## 2024-05-23 DIAGNOSIS — Z3A00 Weeks of gestation of pregnancy not specified: Secondary | ICD-10-CM | POA: Diagnosis not present

## 2024-05-23 DIAGNOSIS — Z3A01 Less than 8 weeks gestation of pregnancy: Secondary | ICD-10-CM | POA: Diagnosis not present

## 2024-05-23 DIAGNOSIS — Z5321 Procedure and treatment not carried out due to patient leaving prior to being seen by health care provider: Secondary | ICD-10-CM | POA: Insufficient documentation

## 2024-05-23 LAB — COMPREHENSIVE METABOLIC PANEL WITH GFR
ALT: 12 U/L (ref 0–44)
AST: 24 U/L (ref 15–41)
Albumin: 4.7 g/dL (ref 3.5–5.0)
Alkaline Phosphatase: 53 U/L (ref 38–126)
Anion gap: 14 (ref 5–15)
BUN: 9 mg/dL (ref 6–20)
CO2: 23 mmol/L (ref 22–32)
Calcium: 10.1 mg/dL (ref 8.9–10.3)
Chloride: 101 mmol/L (ref 98–111)
Creatinine, Ser: 0.77 mg/dL (ref 0.44–1.00)
GFR, Estimated: >60 mL/min (ref >60)
Glucose, Bld: 88 mg/dL (ref 70–99)
Potassium: 3.6 mmol/L (ref 3.5–5.1)
Sodium: 138 mmol/L (ref 135–145)
Total Bilirubin: 0.6 mg/dL (ref 0.0–1.2)
Total Protein: 7.5 g/dL (ref 6.5–8.1)

## 2024-05-23 LAB — LIPASE, BLOOD: Lipase: 23 U/L (ref 11–51)

## 2024-05-23 LAB — URINALYSIS, ROUTINE W REFLEX MICROSCOPIC
Bilirubin Urine: NEGATIVE
Glucose, UA: NEGATIVE mg/dL
Hgb urine dipstick: NEGATIVE
Ketones, ur: 5 mg/dL — AB
Nitrite: NEGATIVE
Protein, ur: NEGATIVE mg/dL
Specific Gravity, Urine: 1.018 (ref 1.005–1.030)
pH: 6 (ref 5.0–8.0)

## 2024-05-23 LAB — CBC
HCT: 31.1 % — ABNORMAL LOW (ref 36.0–46.0)
Hemoglobin: 10 g/dL — ABNORMAL LOW (ref 12.0–15.0)
MCH: 26 pg (ref 26.0–34.0)
MCHC: 32.2 g/dL (ref 30.0–36.0)
MCV: 81 fL (ref 80.0–100.0)
Platelets: 284 K/uL (ref 150–400)
RBC: 3.84 MIL/uL — ABNORMAL LOW (ref 3.87–5.11)
RDW: 17.9 % — ABNORMAL HIGH (ref 11.5–15.5)
WBC: 8.8 K/uL (ref 4.0–10.5)
nRBC: 0 % (ref 0.0–0.2)

## 2024-05-23 MED ORDER — DOXYLAMINE-PYRIDOXINE 10-10 MG PO TBEC: 1.0000 | DELAYED_RELEASE_TABLET | Freq: Every day | ORAL | 0 refills | Status: AC

## 2024-05-23 MED ORDER — ONDANSETRON 8 MG PO TBDP
8.0000 mg | ORAL_TABLET | Freq: Once | ORAL | Status: AC
  Administered 2024-05-23: 8 mg via ORAL
  Filled 2024-05-23: qty 1

## 2024-05-23 MED ORDER — ONDANSETRON 4 MG PO TBDP: 4.0000 mg | ORAL_TABLET | Freq: Three times a day (TID) | ORAL | 0 refills | Status: AC | PRN

## 2024-05-23 NOTE — ED Provider Notes (Signed)
 [facility] Provider Note   Event Date/Time   First MD Initiated Contact with Patient 05/23/24 1706     (approximate) History  Emesis During Pregnancy  HPI Lauren Romero is a 23 y.o. female with a stated past medical history of of 7-week pregnancy who presents complaining of nausea and vomiting that began yesterday.  Patient states that she has had similar episodes with previous pregnancies that were controlled with Zofran .  Patient states that she has never tried Diclegis before.  Patient denies any abdominal pain.  Patient denies any vaginal bleeding. ROS: Patient currently denies any vision changes, tinnitus, difficulty speaking, facial droop, sore throat, chest pain, shortness of breath, abdominal pain, diarrhea, dysuria, or weakness/numbness/paresthesias in any extremity   Physical Exam  Triage Vital Signs: ED Triage Vitals [05/23/24 1653]  Encounter Vitals Group     BP 125/74     Girls Systolic BP Percentile      Girls Diastolic BP Percentile      Boys Systolic BP Percentile      Boys Diastolic BP Percentile      Pulse Rate 72     Resp 18     Temp 98.5 F (36.9 C)     Temp Source Oral     SpO2 100 %     Weight      Height 5' 4 (1.626 m)     Head Circumference      Peak Flow      Pain Score      Pain Loc      Pain Education      Exclude from Growth Chart    Most recent vital signs: Vitals:   05/23/24 1653  BP: 125/74  Pulse: 72  Resp: 18  Temp: 98.5 F (36.9 C)  SpO2: 100%   General: Awake, oriented x4. CV:  Good peripheral perfusion. Resp:  Normal effort. Abd:  No distention. Other:   Adult well-developed, well-nourished African-American female resting comfortably in no acute distress ED Results / Procedures / Treatments  Labs (all labs ordered are listed, but only abnormal results are displayed) Labs Reviewed  CBC - Abnormal; Notable for the following components:      Result Value   RBC 3.84 (*)    Hemoglobin 10.0  (*)    HCT 31.1 (*)    RDW 17.9 (*)    All other components within normal limits  URINALYSIS, ROUTINE W REFLEX MICROSCOPIC - Abnormal; Notable for the following components:   Color, Urine YELLOW (*)    APPearance HAZY (*)    Ketones, ur 5 (*)    Leukocytes,Ua SMALL (*)    Bacteria, UA RARE (*)    All other components within normal limits  LIPASE, BLOOD  COMPREHENSIVE METABOLIC PANEL WITH GFR  POC URINE PREG, ED   PROCEDURES: Critical Care performed: No Procedures MEDICATIONS ORDERED IN ED: Medications  ondansetron  (ZOFRAN -ODT) disintegrating tablet 8 mg (8 mg Oral Given 05/23/24 1756)   IMPRESSION / MDM / ASSESSMENT AND PLAN / ED COURSE  I reviewed the triage vital signs and the nursing notes.                             The patient is on the cardiac monitor to evaluate for evidence of arrhythmia and/or significant heart rate changes. Patient's presentation is most consistent with acute presentation with potential threat to life or bodily function. Patient is a 23 year old female with the  above-stated past medical history including 7-week pregnancy who presents complaining of nausea and vomiting that began yesterday in the morning DDx: Biliary disease, appendicitis, gastroenteritis, morning sickness Plan: CBC, CMP, urinalysis, lipase  Laboratory evaluation shows stable anemia 10.0 with small leukocytes and rare bacteria on her UA.  No evidence of leukocytosis, no evidence of lipase elevation, no evidence of transaminitis or elevated T. bili.  Patient's nausea and vomiting well-controlled with Zofran  and having ginger ale.  Patient agrees with plan for discharge at this time with outpatient OB/GYN and primary care follow-up for continued management of her morning sickness.  Patient was given a prescription for Diclegis and instructions on over-the-counter supplementation if this medication is too expensive.  Patient also given a short course of Zofran  as needed.  Patient given strict  return precautions and all questions answered prior to discharge  Dispo: Discharge home with OB/GYN follow-up   FINAL CLINICAL IMPRESSION(S) / ED DIAGNOSES   Final diagnoses:  Vomiting during pregnancy   Rx / DC Orders   ED Discharge Orders          Ordered    Doxylamine-Pyridoxine 10-10 MG TBEC  Daily at bedtime        05/23/24 1855    ondansetron  (ZOFRAN -ODT) 4 MG disintegrating tablet  Every 8 hours PRN        05/23/24 1855           Note:  This document was prepared using Dragon voice recognition software and may include unintentional dictation errors.   Jossie Artist POUR, MD 05/23/24 [phone]

## 2024-05-23 NOTE — ED Triage Notes (Signed)
 Patient states she is approximately [redacted] weeks pregnant and has had nausea and vomiting since yesterday.

## 2024-05-23 NOTE — ED Triage Notes (Signed)
 Pt states she is currently pregnant (LMP 10/16) G4P3 and has been having nausea and vomiting since yesterday. Several days of poor PO intake per pt. Denies pain, no sick contacts or URI symptoms.

## 2024-06-04 ENCOUNTER — Emergency Department (HOSPITAL_COMMUNITY)
Admission: EM | Admit: 2024-06-04 | Discharge: 2024-06-04 | Discharge status: Against Medical Advice | Attending: Emergency Medicine | Admitting: Emergency Medicine

## 2024-06-04 ENCOUNTER — Other Ambulatory Visit: Payer: Self-pay

## 2024-06-04 DIAGNOSIS — Z5321 Procedure and treatment not carried out due to patient leaving prior to being seen by health care provider: Secondary | ICD-10-CM | POA: Diagnosis not present

## 2024-06-04 DIAGNOSIS — O219 Vomiting of pregnancy, unspecified: Secondary | ICD-10-CM | POA: Diagnosis present

## 2024-06-04 DIAGNOSIS — R630 Anorexia: Secondary | ICD-10-CM | POA: Diagnosis not present

## 2024-06-04 DIAGNOSIS — Z3A08 8 weeks gestation of pregnancy: Secondary | ICD-10-CM | POA: Diagnosis not present

## 2024-06-04 NOTE — ED Triage Notes (Signed)
 PT ambulatory to triage with complaints of poor appetite and vomiting during pregnancy. PT is approx [redacted] weeks pregnant with an EDD 01/09/25. Prenatal care at [facility]. Pt has a RX for nausea meds, but did not pick it up.

## 2024-06-06 NOTE — Patient Instructions (Incomplete)
 First Trimester of Pregnancy  The first trimester of pregnancy starts on the first day of your last monthly period until the end of week 13. This is months 1 through 3 of pregnancy. A week after a sperm fertilizes an egg, the egg will implant into the wall of the uterus and begin to develop into a baby. Body changes during your first trimester Your body goes through many changes during pregnancy. The changes usually return to normal after your baby is born. Physical changes Your breasts may grow larger and may hurt. The area around your nipples may get darker. Your periods will stop. Your hair and nails may grow faster. You may pee more often. Health changes You may tire easily. Your gums may bleed and may be sensitive when you brush and floss. You may not feel hungry. You may have heartburn. You may throw up or feel like you may throw up. You may want to eat some foods, but not others. You may have headaches. You may have trouble pooping (constipation). Other changes Your emotions may change from day to day. You may have more dreams. Follow these instructions at home: Medicines Talk to your health care provider if you're taking medicines. Ask if the medicines are safe to take during pregnancy. Your provider may change the medicines that you take. Do not take any medicines unless told to by your provider. Take a prenatal vitamin that has at least 600 micrograms (mcg) of folic acid. Do not use herbal medicines, illegal substances, or medicines that are not approved by your provider. Eating and drinking While you're pregnant your body needs extra food for your growing baby. Talk with your provider about what to eat while pregnant. Activity Most women are able to exercise during pregnancy. Exercises may need to change as your pregnancy goes on. Talk to your provider about your activities and exercise routines. Relieving pain and discomfort Wear a good, supportive bra if your breasts  hurt. Rest with your legs raised if you have leg cramps or low back pain. Safety Wear your seatbelt at all times when you're in a car. Talk to your provider if someone hits you, hurts you, or yells at you. Talk with your provider if you're feeling sad or have thoughts of hurting yourself. Lifestyle Certain things can be harmful while you're pregnant. Follow these rules: Do not use hot tubs, steam rooms, or saunas. Do not douche. Do not use tampons or scented pads. Do not drink alcohol,smoke, vape, or use products with nicotine or tobacco in them. If you need help quitting, talk with your provider. Avoid cat litter boxes and soil used by cats. These things carry germs that can cause harm to your pregnancy and your baby. General instructions Keep all follow-up visits. It helps you and your unborn baby stay as healthy as possible. Write down your questions. Take them to your visits. Your provider will: Talk with you about your overall health. Give you advice or refer you to specialists who can help with different needs, including: Prenatal education classes. Mental health and counseling. Foods and healthy eating. Ask for help if you need help with food. Call your dentist and ask to be seen. Brush your teeth with a soft toothbrush. Floss gently. Where to find more information American Pregnancy Association: americanpregnancy.org [employer]: acog.org Office on [employer] Health: [employer]Less[employer] Contact a health care provider if: You feel dizzy, faint, or have a fever. You vomit or have watery poop (diarrhea) for 2  days or more. You have abnormal discharge or bleeding from your vagina. You have pain when you pee or your pee smells bad. You have cramps, pain, or pressure in your belly area. Get help right away if: You have trouble breathing or chest pain. You have any kind of injury, such as from a fall or a car crash. These symptoms may be an  emergency. Get help right away. Call 911. Do not wait to see if the symptoms will go away. Do not drive yourself to the hospital. This information is not intended to replace advice given to you by your health care provider. Make sure you discuss any questions you have with your health care provider. Document Revised: 03/09/2023 Document Reviewed: 10/07/2022 Elsevier Patient Education  2024 Elsevier Inc.  Tests and Screening During Pregnancy Tests and screenings during pregnancy are an important part of your prenatal care. These tests help your health care provider find any problems that might affect your pregnancy. Some tests need to be done for all pregnant people, and some are optional. Most of the tests and screenings do not pose any risks for you or your baby. You may need more testing if a test result shows there is a risk to your health or your baby's health. Tests and screenings done early in pregnancy Some tests and screenings you may have in early pregnancy are: Blood tests, such as: Complete blood count (CBC). Blood typing. Tests to check for diseases that can cause birth defects or can be passed to your baby, such as: [place] measles (rubella( and chicken pox. Hepatitis B and C. Human Immunodeficiency Virus (HIV). Syphilis. Zika virus. Pee tests. Blood pressure. Testing for sexually transmitted infections (STIs), such as chlamydia or gonorrhea. Testing for tuberculosis. Ultrasound. Tests and screenings done later in pregnancy Some common tests you can expect to have later in pregnancy include: Rh antibody testing. Pee and blood tests. Glucose screening. This checks your blood sugar. It will show whether you are developing the type of diabetes that happens during pregnancy, called gestational diabetes. You may have this screening earlier if you have risk factors for diabetes. Ultrasound. This may be repeated at 16-20 weeks to check how your baby is growing. Screening for  group B streptococcus (GBS). GBS is a type of bacteria that may live in your rectum or vagina. GBS can spread to your baby during birth. This test is done at 35-37 weeks of pregnancy. Non-stress test. This may be done more often if your pregnancy is high risk. Biophysical profile. This test includes ultrasound imaging and a non-stress test to check to see if your baby is healthy. This test may help decide when your baby should be born. Screening for birth defects Early in your pregnancy, tests can be done to find out if your baby is at risk for a genetic disorder. This testing is optional. The type of testing recommended for you will depend on your family and medical history, your ethnicity, and your age. Testing may include: Screening tests such as ultrasound, blood tests, or a combination of both. Carrier screening. If genetic screening shows that your baby is at risk for a genetic defect, diagnostic testing may be recommended, such as: Amniocentesis. Chorionic villus sampling. Unlike other tests done during pregnancy, diagnostic testing does have some risk for your pregnancy. Talk to your provider about the risks and benefits of genetic testing. Questions to ask your health care provider What tests are recommended for me? When and how will these tests  be done? When will I get the results of the tests? What do the results of these tests mean for me or my baby? Do you recommend any genetic screening tests? Which ones? Should I see a genetic counselor before having genetic screening? Where to find more information Go to americanpregnancy.org Click on search. Type 'prenatal tests in the search box. Go to [employer]Lesson.ca Click on search. Type 'prenatal tests in the search box. Go to acog.org Click on search. Type routine tests in the search box. This information is not intended to replace advice given to you by your health care provider. Make sure you discuss any questions you have  with your health care provider. Document Revised: 04/04/2023 Document Reviewed: 04/04/2023 Elsevier Patient Education  2025 [employer].  Common Medications Safe in Pregnancy  Acne:      Constipation:  Benzoyl Peroxide     Colace  Clindamycin      Dulcolax Suppository  Topica Erythromycin     Fibercon  Salicylic Acid      Metamucil         Miralax AVOID:        Senakot   Accutane    Cough:  Retin-A       Cough Drops  Tetracycline      Phenergan w/ Codeine if Rx  Minocycline      Robitussin (Plain & DM)  Antibiotics:     Crabs/Lice:  Ceclor       RID  Cephalosporins    AVOID:  E-Mycins      Kwell  Keflex  Macrobid/Macrodantin   Diarrhea:  Penicillin      Kao-Pectate  Zithromax      Imodium AD         PUSH FLUIDS AVOID:       Cipro     Fever:  Tetracycline      Tylenol (Regular or Extra  Minocycline       Strength)  Levaquin      Extra Strength-Do not          Exceed 8 tabs/24 hrs Caffeine:        200mg /day (equiv. To 1 cup of coffee or  approx. 3 12 oz sodas)         Gas: Cold/Hayfever:       Gas-X  Benadryl      Mylicon  Claritin       Phazyme  **Claritin-D        Chlor-Trimeton    Headaches:  Dimetapp      ASA-Free Excedrin  Drixoral-Non-Drowsy     Cold Compress  Mucinex (Guaifenasin)     Tylenol (Regular or Extra  Sudafed/Sudafed-12 Hour     Strength)  **Sudafed PE Pseudoephedrine   Tylenol Cold & Sinus     Vicks Vapor Rub  Zyrtec  **AVOID if Problems With Blood Pressure         Heartburn: Avoid lying down for at least 1 hour after meals  Aciphex      Maalox     Rash:  Milk of Magnesia     Benadryl    Mylanta       1% Hydrocortisone Cream  Pepcid  Pepcid Complete   Sleep Aids:  Prevacid      Ambien   Prilosec       Benadryl  Rolaids       Chamomile Tea  Tums (Limit 4/day)     Unisom         Tylenol PM  Warm milk-add vanilla or  Hemorrhoids:       Sugar for taste  Anusol/Anusol H.C.  (RX: Analapram 2.5%)  Sugar  Substitutes:  Hydrocortisone OTC     Ok in moderation  Preparation H      Tucks        Vaseline lotion applied to tissue with wiping    Herpes:     Throat:  Acyclovir      Oragel  Famvir  Valtrex     Vaccines:         Flu Shot Leg Cramps:       *Gardasil  Benadryl      Hepatitis A         Hepatitis B Nasal Spray:       Pneumovax  Saline Nasal Spray     Polio Booster         Tetanus Nausea:       Tuberculosis test or PPD  Vitamin B6 25 mg TID   AVOID:    Dramamine      *Gardasil  Emetrol       Live Poliovirus  Ginger Root 250 mg QID    MMR (measles, mumps &  High Complex Carbs @ Bedtime    rebella)  Sea Bands-Accupressure    Varicella (Chickenpox)  Unisom 1/2 tab TID     *No known complications           If received before Pain:         Known pregnancy;   Darvocet       Resume series after  Lortab        Delivery  Percocet    Yeast:   Tramadol      Femstat  Tylenol 3      Gyne-lotrimin  Ultram       Monistat  Vicodin           MISC:         All Sunscreens           Hair Coloring/highlights          Insect Repellant's          (Including DEET)         Mystic Tans  Commonly Asked Questions During Pregnancy  Cats: A parasite can be excreted in cat feces.  To avoid exposure you need to have another person empty the little box.  If you must empty the litter box you will need to wear gloves.  Wash your hands after handling your cat.  This parasite can also be found in raw or undercooked meat so this should also be avoided.  Colds, Sore Throats, Flu: Please check your medication sheet to see what you can take for symptoms.  If your symptoms are unrelieved by these medications please call the office.  Dental Work: Most any dental work Agricultural consultant recommends is permitted.  X-rays should only be taken during the first trimester if absolutely necessary.  Your abdomen should be shielded with a lead apron during all x-rays.  Please notify your provider prior to receiving any x-rays.   Novocaine is fine; gas is not recommended.  If your dentist requires a note from us  prior to dental work please call the office and we will provide one for you.  Exercise: Exercise is an important part of staying healthy during your pregnancy.  You may continue most exercises you were accustomed to prior to pregnancy.  Later in your pregnancy you will most likely notice you have  difficulty with activities requiring balance like riding a bicycle.  It is important that you listen to your body and avoid activities that put you at a higher risk of falling.  Adequate rest and staying well hydrated are a must!  If you have questions about the safety of specific activities ask your provider.    Exposure to Children with illness: Try to avoid obvious exposure; report any symptoms to us  when noted,  If you have chicken pos, red measles or mumps, you should be immune to these diseases.   Please do not take any vaccines while pregnant unless you have checked with your OB provider.  Fetal Movement: After 28 weeks we recommend you do kick counts twice daily.  Lie or sit down in a calm quiet environment and count your baby movements kicks.  You should feel your baby at least 10 times per hour.  If you have not felt 10 kicks within the first hour get up, walk around and have something sweet to eat or drink then repeat for an additional hour.  If count remains less than 10 per hour notify your provider.  Fumigating: Follow your pest control agent's advice as to how long to stay out of your home.  Ventilate the area well before re-entering.  Hemorrhoids:   Most over-the-counter preparations can be used during pregnancy.  Check your medication to see what is safe to use.  It is important to use a stool softener or fiber in your diet and to drink lots of liquids.  If hemorrhoids seem to be getting worse please call the office.   Hot Tubs:  Hot tubs Jacuzzis and saunas are not recommended while pregnant.  These  increase your internal body temperature and should be avoided.  Intercourse:  Sexual intercourse is safe during pregnancy as long as you are comfortable, unless otherwise advised by your provider.  Spotting may occur after intercourse; report any bright red bleeding that is heavier than spotting.  Labor:  If you know that you are in labor, please go to the hospital.  If you are unsure, please call the office and let us  help you decide what to do.  Lifting, straining, etc:  If your job requires heavy lifting or straining please check with your provider for any limitations.  Generally, you should not lift items heavier than that you can lift simply with your hands and arms (no back muscles)  Painting:  Paint fumes do not harm your pregnancy, but may make you ill and should be avoided if possible.  Latex or water based paints have less odor than oils.  Use adequate ventilation while painting.  Permanents & Hair Color:  Chemicals in hair dyes are not recommended as they cause increase hair dryness which can increase hair loss during pregnancy.   Highlighting and permanents are allowed.  Dye may be absorbed differently and permanents may not hold as well during pregnancy.  Sunbathing:  Use a sunscreen, as skin burns easily during pregnancy.  Drink plenty of fluids; avoid over heating.  Tanning Beds:  Because their possible side effects are still unknown, tanning beds are not recommended.  Ultrasound Scans:  Routine ultrasounds are performed at approximately 20 weeks.  You will be able to see your baby's general anatomy an if you would like to know the gender this can usually be determined as well.  If it is questionable when you conceived you may also receive an ultrasound early in your pregnancy for dating purposes.  Otherwise  ultrasound exams are not routinely performed unless there is a medical necessity.  Although you can request a scan we ask that you pay for it when conducted because insurance  does not cover  patient request scans.  Work: If your pregnancy proceeds without complications you may work until your due date, unless your physician or employer advises otherwise.  Round Ligament Pain/Pelvic Discomfort:  Sharp, shooting pains not associated with bleeding are fairly common, usually occurring in the second trimester of pregnancy.  They tend to be worse when standing up or when you remain standing for long periods of time.  These are the result of pressure of certain pelvic ligaments called round ligaments.  Rest, Tylenol and heat seem to be the most effective relief.  As the womb and fetus grow, they rise out of the pelvis and the discomfort improves.  Please notify the office if your pain seems different than that described.  It may represent a more serious condition.

## 2024-06-06 NOTE — Progress Notes (Deleted)
 New OB Intake  I connected with  Lauren Romero on 06/06/2024 at 11:15 AM EST by {Contact:[id number]} Video Visit and verified that I am speaking with the correct person using two identifiers. Nurse is located at [facility] and pt is located at [facility]  I discussed the limitations, risks, security and privacy concerns of performing an evaluation and management service by telephone and the availability of in person appointments. I also discussed with the patient that there may be a patient responsible charge related to this service. The patient expressed understanding and agreed to proceed.  I explained I am completing New OB Intake today. We discussed her EDD of *** that is based on LMP of ***. Pt is G***/P***. I reviewed her allergies, medications, Medical/Surgical/OB history, and appropriate screenings. There are cats in the home: {yes/no:63}. If yes: {Desc; indoor/outdoor:13239}. Based on history, this is a/an pregnancy {Complicated/Uncomplicated Pregnancy:20185} . Her obstetrical history is significant for {ob risk factors:10154}.  Patient Active Problem List   Diagnosis Date Noted   Preterm contractions 05/12/2021   Hx of preeclampsia, prior pregnancy, currently pregnant, third trimester 05/12/2021   Prior pregnancy complicated by IUGR, antepartum, third trimester 05/12/2021   Preterm labor 05/12/2021   H/O transfusion of packed red blood cells 05/09/2020   Insufficient prenatal care in third trimester 04/12/2020    Concerns addressed today:   Delivery Plans:  Plans to deliver at [facility].  Anatomy US  Explained first scheduled US  will be ***. Anatomy US  will be scheduled around [redacted] weeks gestational age.  Labs Discussed genetic screening with patient. Patient *** genetic testing to be drawn at new OB visit. Discussed possible labs to be drawn at new OB appointment.  COVID Vaccine Patient {HAS/HAS NOT:20194} had COVID vaccine.   Social Determinants of  Health Food Insecurity: {qnni7:72521} WIC Referral: Patient {ACTION; IS/IS WNU:[id number]} interested in referral to [facility].  Transportation: {transportation:25542} Childcare: Discussed no children allowed at ultrasound appointments.   First visit review I reviewed new OB appt with pt. I explained she will have blood work and pap smear/pelvic exam if indicated. Explained pt will be seen by *** at first visit; encounter routed to appropriate provider.   Lauren Romero, CMA 06/06/2024  11:30 AM

## 2024-06-07 ENCOUNTER — Telehealth

## 2024-06-10 ENCOUNTER — Telehealth: Payer: Self-pay | Admitting: Certified Nurse Midwife

## 2024-06-10 NOTE — Telephone Encounter (Signed)
 Reached out to pt to reschedule NOB Nurse Intake appt that was scheduled on 1219/2025 at 11:15.  Pt stated that she is going to [facility] for her prenatal care.

## 2024-06-17 ENCOUNTER — Other Ambulatory Visit
